# Patient Record
Sex: Female | Born: 1990 | Race: Black or African American | Hispanic: No | Marital: Single | Smoking: Never smoker
Health system: Southern US, Community
[De-identification: ages and names within clinical notes are randomized; demographics above are authoritative.]

## PROBLEM LIST (undated history)

## (undated) DIAGNOSIS — I509 Heart failure, unspecified: Secondary | ICD-10-CM

---

## 2015-08-25 ENCOUNTER — Ambulatory Visit
Admission: EM | Admit: 2015-08-25 | Discharge: 2015-08-25 | Disposition: A | Discharge status: Home / Self Care | Payer: Self-pay | Attending: Family Medicine | Admitting: Family Medicine

## 2015-08-25 ENCOUNTER — Encounter: Payer: Self-pay | Admitting: Emergency Medicine

## 2015-08-25 DIAGNOSIS — N390 Urinary tract infection, site not specified: Secondary | ICD-10-CM | POA: Insufficient documentation

## 2015-08-25 LAB — URINALYSIS COMPLETE WITH MICROSCOPIC (ARMC ONLY)
PH: 5 (ref 5.0–8.0)
Specific Gravity, Urine: 1.01 (ref 1.005–1.030)

## 2015-08-25 MED ORDER — PHENAZOPYRIDINE HCL 200 MG PO TABS: 200.0000 mg | ORAL_TABLET | Freq: Three times a day (TID) | ORAL | Status: DC | PRN

## 2015-08-25 MED ORDER — NITROFURANTOIN MONOHYD MACRO 100 MG PO CAPS: 100.0000 mg | ORAL_CAPSULE | Freq: Two times a day (BID) | ORAL | Status: DC

## 2015-08-25 MED ORDER — FLUCONAZOLE 150 MG PO TABS: 150.0000 mg | ORAL_TABLET | Freq: Once | ORAL | Status: DC

## 2015-08-25 NOTE — Discharge Instructions (Signed)
Urinary Tract Infection °A urinary tract infection (UTI) can occur any place along the urinary tract. The tract includes the kidneys, ureters, bladder, and urethra. A type of germ called bacteria often causes a UTI. UTIs are often helped with antibiotic medicine.  °HOME CARE  °· If given, take antibiotics as told by your doctor. Finish them even if you start to feel better. °· Drink enough fluids to keep your pee (urine) clear or pale yellow. °· Avoid tea, drinks with caffeine, and bubbly (carbonated) drinks. °· Pee often. Avoid holding your pee in for a long time. °· Pee before and after having sex (intercourse). °· Wipe from front to back after you poop (bowel movement) if you are a woman. Use each tissue only once. °GET HELP RIGHT AWAY IF:  °· You have back pain. °· You have lower belly (abdominal) pain. °· You have chills. °· You feel sick to your stomach (nauseous). °· You throw up (vomit). °· Your burning or discomfort with peeing does not go away. °· You have a fever. °· Your symptoms are not better in 3 days. °MAKE SURE YOU:  °· Understand these instructions. °· Will watch your condition. °· Will get help right away if you are not doing well or get worse. °  °This information is not intended to replace advice given to you by your health care provider. Make sure you discuss any questions you have with your health care provider. °  °Document Released: 08/03/2007 Document Revised: 03/07/2014 Document Reviewed: 09/15/2011 °Elsevier Interactive Patient Education ©2016 [employer] ° °Dysuria °Dysuria is pain or discomfort while urinating. The pain or discomfort may be felt in the tube that carries urine out of the bladder (urethra) or in the surrounding tissue of the genitals. The pain may also be felt in the groin area, lower abdomen, and lower back. You may have to urinate frequently or have the sudden feeling that you have to urinate (urgency). Dysuria can affect both men and women, but is more common in  women. °Dysuria can be caused by many different things, including: °· Urinary tract infection in women. °· Infection of the kidney or bladder. °· Kidney stones or bladder stones. °· Certain sexually transmitted infections (STIs), such as chlamydia. °· Dehydration. °· Inflammation of the vagina. °· Use of certain medicines. °· Use of certain soaps or scented products that cause irritation. °HOME CARE INSTRUCTIONS °Watch your dysuria for any changes. The following actions may help to reduce any discomfort you are feeling: °· Drink enough fluid to keep your urine clear or pale yellow. °· Empty your bladder often. Avoid holding urine for long periods of time. °· After a bowel movement or urination, women should cleanse from front to back, using each tissue only once. °· Empty your bladder after sexual intercourse. °· Take medicines only as directed by your health care provider. °· If you were prescribed an antibiotic medicine, finish it all even if you start to feel better. °· Avoid caffeine, tea, and alcohol. They can irritate the bladder and make dysuria worse. In men, alcohol may irritate the prostate. °· Keep all follow-up visits as directed by your health care provider. This is important. °· If you had any tests done to find the cause of dysuria, it is your responsibility to obtain your test results. Ask the lab or department performing the test when and how you will get your results. Talk with your health care provider if you have any questions about your results. °SEEK MEDICAL CARE   IF: °· You develop pain in your back or sides. °· You have a fever. °· You have nausea or vomiting. °· You have blood in your urine. °· You are not urinating as often as you usually do. °SEEK IMMEDIATE MEDICAL CARE IF: °· You pain is severe and not relieved with medicines. °· You are unable to hold down any fluids. °· You or someone else notices a change in your mental function. °· You have a rapid heartbeat at rest. °· You have  shaking or chills. °· You feel extremely weak. °  °This information is not intended to replace advice given to you by your health care provider. Make sure you discuss any questions you have with your health care provider. °  °Document Released: 11/13/2003 Document Revised: 03/07/2014 Document Reviewed: 10/10/2013 °Elsevier Interactive Patient Education ©2016 [employer]vier Inc. ° °

## 2015-08-25 NOTE — ED Provider Notes (Signed)
CSN: [id number]     Arrival date & time 08/25/15  1620 History   First MD Initiated Contact with Patient 08/25/15 1734    Nurses notes were reviewed. Chief Complaint  Patient presents with  . Urinary Frequency   Patient is here because of urinary tract symptoms. She reports frequency dysuria and urgency. She reports no vaginal discharge dyspareunia and she did was treated for bacterial vaginosis about 2-3 weeks ago. She never smoked and only surgeries C-section. No known drug allergies and no pertinent family medical history. She will be noted that states that she was having some burning that she did take some Azo which helped a little bit.    She never smoked. No known drug allergies and no pertinent or meaningful family history pertaining to today's visit    (Consider location/radiation/quality/duration/timing/severity/associated sxs/prior Treatment) Patient is a 25 y.o. female presenting with frequency. The history is provided by the patient. No language interpreter was used.  Urinary Frequency This is a new problem. The current episode started more than 2 days ago. The problem occurs constantly. The problem has been gradually worsening. Pertinent negatives include no chest pain, no abdominal pain, no headaches and no shortness of breath. Nothing aggravates the symptoms. Relieved by: azo. Treatments tried: Azo was used. The treatment provided no relief.    History reviewed. No pertinent past medical history. Past Surgical History  Procedure Laterality Date  . Cesarean section     No family history on file. Social History  Substance Use Topics  . Smoking status: Never Smoker   . Smokeless tobacco: Never Used  . Alcohol Use: Yes   OB History    No data available     Review of Systems  Respiratory: Negative for shortness of breath.   Cardiovascular: Negative for chest pain.  Gastrointestinal: Negative for abdominal pain.  Genitourinary: Positive for frequency.  Neurological:  Negative for headaches.  All other systems reviewed and are negative.   Allergies  Review of patient's allergies indicates no known allergies.  Home Medications   Prior to Admission medications   Medication Sig Start Date End Date Taking? Authorizing Provider  fluconazole (DIFLUCAN) 150 MG tablet Take 1 tablet (150 mg total) by mouth once. 08/25/15   Hassan RowanEugene Madicyn Mesina, MD  nitrofurantoin, macrocrystal-monohydrate, (MACROBID) 100 MG capsule Take 1 capsule (100 mg total) by mouth 2 (two) times daily. 08/25/15   Hassan RowanEugene Gurjot Brisco, MD  phenazopyridine (PYRIDIUM) 200 MG tablet Take 1 tablet (200 mg total) by mouth 3 (three) times daily as needed for pain. 08/25/15   Hassan RowanEugene Dariusz Brase, MD   Meds Ordered and Administered this Visit  Medications - No data to display  BP 114/83 mmHg  Pulse 80  Temp(Src) 97.7 F (36.5 C)  Resp 18  Ht 4\' 11"  (1.499 m)  Wt 130 lb (58.968 kg)  BMI 26.24 kg/m2  SpO2 100%  LMP 08/12/2015 No data found.   Physical Exam  Constitutional: She is oriented to person, place, and time. She appears well-developed and well-nourished.  HENT:  Head: Normocephalic and atraumatic.  Eyes: Conjunctivae are normal. Pupils are equal, round, and reactive to light.  Neck: Neck supple.  Abdominal: Soft. Bowel sounds are normal. There is no tenderness.  Musculoskeletal: Normal range of motion.  Neurological: She is alert and oriented to person, place, and time.  Skin: Skin is warm and dry.  Psychiatric: She has a normal mood and affect.  Vitals reviewed.   ED Course  Procedures (including critical care time)  Labs Review  Labs Reviewed  URINALYSIS COMPLETEWITH MICROSCOPIC ([facility] ONLY) - Abnormal; Notable for the following:    Color, Urine ORANGE (*)    APPearance CLOUDY (*)    Glucose, UA   (*)    Value: TEST NOT REPORTED DUE TO COLOR INTERFERENCE OF URINE PIGMENT   Bilirubin Urine   (*)    Value: TEST NOT REPORTED DUE TO COLOR INTERFERENCE OF URINE PIGMENT   Ketones, ur   (*)     Value: TEST NOT REPORTED DUE TO COLOR INTERFERENCE OF URINE PIGMENT   Hgb urine dipstick   (*)    Value: TEST NOT REPORTED DUE TO COLOR INTERFERENCE OF URINE PIGMENT   Protein, ur   (*)    Value: TEST NOT REPORTED DUE TO COLOR INTERFERENCE OF URINE PIGMENT   Nitrite   (*)    Value: TEST NOT REPORTED DUE TO COLOR INTERFERENCE OF URINE PIGMENT   Leukocytes, UA   (*)    Value: TEST NOT REPORTED DUE TO COLOR INTERFERENCE OF URINE PIGMENT   Bacteria, UA MANY (*)    Squamous Epithelial / LPF 6-30 (*)    All other components within normal limits  URINE CULTURE    Imaging Review No results found.   Visual Acuity Review  Right Eye Distance:   Left Eye Distance:   Bilateral Distance:    Right Eye Near:   Left Eye Near:    Bilateral Near:     Results for orders placed or performed during the hospital encounter of 08/25/15  Urinalysis complete, with microscopic  Result Value Ref Range   Color, Urine ORANGE (A) YELLOW   APPearance CLOUDY (A) CLEAR   Glucose, UA (A) NEGATIVE mg/dL    TEST NOT REPORTED DUE TO COLOR INTERFERENCE OF URINE PIGMENT   Bilirubin Urine (A) NEGATIVE    TEST NOT REPORTED DUE TO COLOR INTERFERENCE OF URINE PIGMENT   Ketones, ur (A) NEGATIVE mg/dL    TEST NOT REPORTED DUE TO COLOR INTERFERENCE OF URINE PIGMENT   Specific Gravity, Urine 1.010 1.005 - 1.030   Hgb urine dipstick (A) NEGATIVE    TEST NOT REPORTED DUE TO COLOR INTERFERENCE OF URINE PIGMENT   pH 5.0 5.0 - 8.0   Protein, ur (A) NEGATIVE mg/dL    TEST NOT REPORTED DUE TO COLOR INTERFERENCE OF URINE PIGMENT   Nitrite (A) NEGATIVE    TEST NOT REPORTED DUE TO COLOR INTERFERENCE OF URINE PIGMENT   Leukocytes, UA (A) NEGATIVE    TEST NOT REPORTED DUE TO COLOR INTERFERENCE OF URINE PIGMENT   RBC / HPF 0-5 0 - 5 RBC/hpf   WBC, UA TOO NUMEROUS TO COUNT 0 - 5 WBC/hpf   Bacteria, UA MANY (A) NONE SEEN   Squamous Epithelial / LPF 6-30 (A) NONE SEEN   WBC Clumps PRESENT      MDM   1. UTI (lower urinary  tract infection)    Unfortunately urine specimen was not good. Will will still treat with Macrobid since pH was only 5. Pyridium 200 mg 3 times a day for the next 5 days and Diflucan as needed for yeast infection since she was just on Flagyl or some other type anabiotic for bacterial vaginosis about 2-3 weeks ago. Follow-up with PCP in a week if needed.    Note: This dictation was prepared with Dragon dictation along with smaller phrase technology. Any transcriptional errors that result from this process are unintentional.  Hassan RowanEugene Kashus Karlen, MD 08/25/15 1820

## 2015-08-25 NOTE — ED Notes (Signed)
Frequency, pain when urinating, feels like bladder want empty for 2 days

## 2015-08-28 ENCOUNTER — Telehealth: Payer: Self-pay | Admitting: Emergency Medicine

## 2015-08-28 LAB — URINE CULTURE
Culture: >=100000 — AB
Special Requests: NORMAL

## 2015-08-28 NOTE — ED Notes (Signed)
Patient states that her urinary symptoms have completely resolved with taking the Macrobid.  Patient was informed of her urine culture result.  Patient was instructed to finish all of her antibiotic and if her symptoms worsen to follow-up here or with her PCP. Patient verbalized understanding.

## 2015-12-28 ENCOUNTER — Encounter: Payer: Self-pay | Admitting: Emergency Medicine

## 2015-12-28 ENCOUNTER — Ambulatory Visit
Admission: EM | Admit: 2015-12-28 | Discharge: 2015-12-28 | Disposition: A | Discharge status: Home / Self Care | Payer: Medicaid Other | Attending: Family Medicine | Admitting: Family Medicine

## 2015-12-28 DIAGNOSIS — N3090 Cystitis, unspecified without hematuria: Secondary | ICD-10-CM

## 2015-12-28 LAB — URINALYSIS COMPLETE WITH MICROSCOPIC (ARMC ONLY)
Bilirubin Urine: NEGATIVE
Glucose, UA: NEGATIVE mg/dL
KETONES UR: NEGATIVE mg/dL
Nitrite: NEGATIVE
PROTEIN: 30 mg/dL — AB
Specific Gravity, Urine: 1.025 (ref 1.005–1.030)
pH: 5.5 (ref 5.0–8.0)

## 2015-12-28 LAB — PREGNANCY, URINE: Preg Test, Ur: NEGATIVE

## 2015-12-28 MED ORDER — FLUCONAZOLE 150 MG PO TABS: 150.0000 mg | ORAL_TABLET | Freq: Every day | ORAL | 1 refills | Status: DC

## 2015-12-28 MED ORDER — CIPROFLOXACIN HCL 500 MG PO TABS: 500.0000 mg | ORAL_TABLET | Freq: Two times a day (BID) | ORAL | 0 refills | Status: DC

## 2015-12-28 NOTE — ED Triage Notes (Signed)
Patient c/o burning when urinating and increase in urinary frequency that started last night.   

## 2015-12-28 NOTE — ED Provider Notes (Signed)
[facility]    CSN: [id number] Arrival date & time: 12/28/15  1738     History   Chief Complaint Chief Complaint  Patient presents with  . Dysuria    HPI Allison Adams is a 25 y.o. female.   The history is provided by the patient.  Dysuria  Pain quality:  Burning Pain severity:  Mild Onset quality:  Sudden Duration:  1 day Timing:  Constant Progression:  Worsening Chronicity:  New Recent urinary tract infections: no   Relieved by:  None tried Urinary symptoms: frequent urination   Urinary symptoms: no discolored urine, no foul-smelling urine, no hematuria and no hesitancy   Associated symptoms: no abdominal pain, no fever, no flank pain, no genital lesions, no nausea, no vaginal discharge and no vomiting   Risk factors: sexually active   Risk factors: no hx of pyelonephritis, no hx of urolithiasis, no kidney transplant, not pregnant, no recurrent urinary tract infections, no renal cysts, no renal disease, no sexually transmitted infections, no single kidney and no urinary catheter     History reviewed. No pertinent past medical history.  There are no active problems to display for this patient.   Past Surgical History:  Procedure Laterality Date  . CESAREAN SECTION      OB History    Gravida Para Term Preterm AB Living   1             SAB TAB Ectopic Multiple Live Births                   Home Medications    Prior to Admission medications   Medication Sig Start Date End Date Taking? Authorizing Provider  ciprofloxacin (CIPRO) 500 MG tablet Take 1 tablet (500 mg total) by mouth every 12 (twelve) hours. 12/28/15   Payton Mccallumrlando Manuel Dall, MD  fluconazole (DIFLUCAN) 150 MG tablet Take 1 tablet (150 mg total) by mouth daily. 12/28/15   Payton Mccallumrlando Ermal Haberer, MD    Family History History reviewed. No pertinent family history.  Social History Social History  Substance Use Topics  . Smoking status: Never Smoker  . Smokeless tobacco: Never Used  . Alcohol  use Yes     Allergies   Review of patient's allergies indicates no known allergies.   Review of Systems Review of Systems  Constitutional: Negative for fever.  Gastrointestinal: Negative for abdominal pain, nausea and vomiting.  Genitourinary: Positive for dysuria. Negative for flank pain and vaginal discharge.     Physical Exam Triage Vital Signs ED Triage Vitals  Enc Vitals Group     BP 12/28/15 1805 122/85     Pulse Rate 12/28/15 1805 84     Resp 12/28/15 1805 16     Temp 12/28/15 1805 97 F (36.1 C)     Temp Source 12/28/15 1805 Tympanic     SpO2 12/28/15 1805 100 %     Weight 12/28/15 1805 130 lb (59 kg)     Height 12/28/15 1805 4\' 11"  (1.499 m)     Head Circumference --      Peak Flow --      Pain Score 12/28/15 1806 2     Pain Loc --      Pain Edu? --      Excl. in GC? --    No data found.   Updated Vital Signs BP 122/85 (BP Location: Right Arm)   Pulse 84   Temp 97 F (36.1 C) (Tympanic)   Resp 16   Ht 4'  11" (1.499 m)   Wt 130 lb (59 kg)   LMP 12/07/2015 (Approximate)   SpO2 100%   BMI 26.26 kg/m   Visual Acuity Right Eye Distance:   Left Eye Distance:   Bilateral Distance:    Right Eye Near:   Left Eye Near:    Bilateral Near:     Physical Exam  Constitutional: She appears well-developed and well-nourished. No distress.  Abdominal: Soft. She exhibits no distension and no mass. There is no tenderness. There is no rebound and no guarding.  Skin: She is not diaphoretic.  Nursing note and vitals reviewed.    UC Treatments / Results  Labs (all labs ordered are listed, but only abnormal results are displayed) Labs Reviewed  URINALYSIS COMPLETEWITH MICROSCOPIC ([facility] ONLY) - Abnormal; Notable for the following:       Result Value   APPearance CLOUDY (*)    Hgb urine dipstick LARGE (*)    Protein, ur 30 (*)    Leukocytes, UA MODERATE (*)    Bacteria, UA FEW (*)    Squamous Epithelial / LPF 0-5 (*)    All other components within normal  limits  URINE CULTURE  PREGNANCY, URINE    EKG  EKG Interpretation None       Radiology No results found.  Procedures Procedures (including critical care time)  Medications Ordered in UC Medications - No data to display   Initial Impression / Assessment and Plan / UC Course  I have reviewed the triage vital signs and the nursing notes.  Pertinent labs & imaging results that were available during my care of the patient were reviewed by me and considered in my medical decision making (see chart for details).  Clinical Course      Final Clinical Impressions(s) / UC Diagnoses   Final diagnoses:  Cystitis    New Prescriptions Discharge Medication List as of 12/28/2015  7:05 PM    START taking these medications   Details  ciprofloxacin (CIPRO) 500 MG tablet Take 1 tablet (500 mg total) by mouth every 12 (twelve) hours., Starting Mon 12/28/2015, Normal    fluconazole (DIFLUCAN) 150 MG tablet Take 1 tablet (150 mg total) by mouth daily., Starting Mon 12/28/2015, Normal       1. Lab results and diagnosis reviewed with patient 2. rx as per orders above; reviewed possible side effects, interactions, risks and benefits  3. Recommend supportive treatment with increased water intake, otc analgesics 4. Follow-up prn if symptoms worsen or don't improve   Payton Mccallumrlando Anuel Sitter, MD 12/28/15 1935

## 2015-12-31 LAB — URINE CULTURE: Culture: >=100000 — AB

## 2016-01-01 ENCOUNTER — Telehealth: Payer: Self-pay | Admitting: Emergency Medicine

## 2016-01-01 NOTE — Telephone Encounter (Signed)
Patient notified of her urine culture result.  Patient states that her symptoms have improved.  Patient instructed to finish her antibiotic and to follow-up here or with her PCP if her symptoms worsen.  Patient verbalized understanding.

## 2016-04-01 ENCOUNTER — Ambulatory Visit
Admission: EM | Admit: 2016-04-01 | Discharge: 2016-04-01 | Disposition: A | Discharge status: Home / Self Care | Payer: Medicaid Other | Attending: Family Medicine | Admitting: Family Medicine

## 2016-04-01 ENCOUNTER — Encounter: Payer: Self-pay | Admitting: Gynecology

## 2016-04-01 DIAGNOSIS — A64 Unspecified sexually transmitted disease: Secondary | ICD-10-CM | POA: Insufficient documentation

## 2016-04-01 DIAGNOSIS — N939 Abnormal uterine and vaginal bleeding, unspecified: Secondary | ICD-10-CM | POA: Diagnosis not present

## 2016-04-01 DIAGNOSIS — B3731 Acute candidiasis of vulva and vagina: Secondary | ICD-10-CM

## 2016-04-01 DIAGNOSIS — N760 Acute vaginitis: Secondary | ICD-10-CM

## 2016-04-01 DIAGNOSIS — B373 Candidiasis of vulva and vagina: Secondary | ICD-10-CM

## 2016-04-01 LAB — WET PREP, GENITAL
Sperm: NONE SEEN
Trich, Wet Prep: NONE SEEN

## 2016-04-01 LAB — CHLAMYDIA/NGC RT PCR (ARMC ONLY)
Chlamydia Tr: NOT DETECTED
N gonorrhoeae: NOT DETECTED

## 2016-04-01 MED ORDER — FLUCONAZOLE 150 MG PO TABS: ORAL_TABLET | ORAL | 0 refills | Status: DC

## 2016-04-01 MED ORDER — METRONIDAZOLE 500 MG PO TABS: 500.0000 mg | ORAL_TABLET | Freq: Two times a day (BID) | ORAL | 0 refills | Status: DC

## 2016-04-01 NOTE — ED Provider Notes (Signed)
CSN: [id number]     Arrival date & time 04/01/16  1619 History   First MD Initiated Contact with Patient 04/01/16 1742     Chief Complaint  Patient presents with  . SEXUALLY TRANSMITTED DISEASE  . Vaginitis   (Consider location/radiation/quality/duration/timing/severity/associated sxs/prior Treatment) HPI  Is a 26 year old female who presents with vaginal itching and discharge for 3-4 days. She states that she would like to be tested for STDs and HIV syphilis. States that she just started her period today. States that she has had multiple partners in the past but is currently not sexually active. He has just recently on amoxicillin for a abscess tooth is prone to yeast infections when she takes broad-spectrum antibiotics.      History reviewed. No pertinent past medical history. Past Surgical History:  Procedure Laterality Date  . CESAREAN SECTION     No family history on file. Social History  Substance Use Topics  . Smoking status: Never Smoker  . Smokeless tobacco: Never Used  . Alcohol use Yes   OB History    Gravida Para Term Preterm AB Living   1             SAB TAB Ectopic Multiple Live Births                 Review of Systems  Constitutional: Negative for activity change, appetite change, chills, fatigue and fever.  Genitourinary: Positive for vaginal bleeding and vaginal discharge. Negative for vaginal pain.  All other systems reviewed and are negative.   Allergies  Patient has no known allergies.  Home Medications   Prior to Admission medications   Medication Sig Start Date End Date Taking? Authorizing Provider  amoxicillin (AMOXIL) 500 MG tablet Take 500 mg by mouth 2 (two) times daily.   Yes Historical Provider, MD  fluconazole (DIFLUCAN) 150 MG tablet Take one tab for symptoms of yeast infection. Repeat x 1 in 72 hours. 04/01/16   Lutricia FeilWilliam P Roemer, PA-C  metroNIDAZOLE (FLAGYL) 500 MG tablet Take 1 tablet (500 mg total) by mouth 2 (two) times daily. 04/01/16    Lutricia FeilWilliam P Roemer, PA-C   Meds Ordered and Administered this Visit  Medications - No data to display  BP 120/76 (BP Location: Left Arm)   Pulse 80   Temp 98.5 F (36.9 C) (Oral)   Resp 16   Ht 4\' 11"  (1.499 m)   Wt 134 lb (60.8 kg)   LMP 04/09/2015   SpO2 100%   BMI 27.06 kg/m  No data found.   Physical Exam  Constitutional: She is oriented to person, place, and time. She appears well-developed and well-nourished. No distress.  HENT:  Head: Normocephalic and atraumatic.  Eyes: EOM are normal. Pupils are equal, round, and reactive to light. Right eye exhibits no discharge. Left eye exhibits no discharge.  Neck: Normal range of motion. Neck supple.  Genitourinary: Uterus normal. Vaginal discharge found.  Genitourinary Comments: Exam was performed with Durward Mallardamille, CMA as chaperone and assisted. Genitalia is normal. There is bright red blood introitus and bleeding covering her inner thighs and vulva. Speculum was then placed with the findings of more of blood. She did have some white creamy discharge adherent to the vaginal walls. Samples were obtained and sent to laboratory for analysis. Bimanuel was then performed after speculum removal. There is no tenderness to cervical motion no adnexal tenderness present.  Musculoskeletal: Normal range of motion.  Neurological: She is alert and oriented to person, place, and time.  Skin: Skin is warm and dry. She is not diaphoretic.  Psychiatric: She has a normal mood and affect. Her behavior is normal. Judgment and thought content normal.  Nursing note and vitals reviewed.   Urgent Care Course     Procedures (including critical care time)  Labs Review Labs Reviewed  WET PREP, GENITAL - Abnormal; Notable for the following:       Result Value   Yeast Wet Prep HPF POC PRESENT (*)    Clue Cells Wet Prep HPF POC PRESENT (*)    WBC, Wet Prep HPF POC FEW (*)    All other components within normal limits  CHLAMYDIA/NGC RT PCR ([facility]  ONLY)  HIV ANTIBODY (ROUTINE TESTING)  RPR    Imaging Review No results found.   Visual Acuity Review  Right Eye Distance:   Left Eye Distance:   Bilateral Distance:    Right Eye Near:   Left Eye Near:    Bilateral Near:         MDM   1. Vaginitis and vulvovaginitis   2. Candida vaginitis    New Prescriptions   FLUCONAZOLE (DIFLUCAN) 150 MG TABLET    Take one tab for symptoms of yeast infection. Repeat x 1 in 72 hours.   METRONIDAZOLE (FLAGYL) 500 MG TABLET    Take 1 tablet (500 mg total) by mouth 2 (two) times daily.  Plan: 1. Test/x-ray results and diagnosis reviewed with patient 2. rx as per orders; risks, benefits, potential side effects reviewed with patient 3. Recommend supportive treatment with Use of medicines as prescribed. The condom during sex while on the medications. Avoid alcohol use while on Flagyl. Patient will be notified of results of the STD workup. 4. F/u prn if symptoms worsen or don't improve     Lutricia Feil, PA-C 04/01/16 1830

## 2016-04-01 NOTE — ED Triage Notes (Signed)
Per patient c/o vaginal itching / discharge x 3-4 days ago. Per patient would like to be tested for Yeast infection, STDs and HIV.

## 2016-04-02 LAB — RPR: RPR Ser Ql: NONREACTIVE

## 2016-04-02 LAB — HIV ANTIBODY (ROUTINE TESTING W REFLEX): HIV Screen 4th Generation wRfx: NONREACTIVE

## 2016-06-06 ENCOUNTER — Encounter: Payer: Self-pay | Admitting: *Deleted

## 2016-06-06 ENCOUNTER — Ambulatory Visit
Admission: EM | Admit: 2016-06-06 | Discharge: 2016-06-06 | Disposition: A | Discharge status: Home / Self Care | Payer: Medicaid Other | Attending: Family Medicine | Admitting: Family Medicine

## 2016-06-06 DIAGNOSIS — Z3201 Encounter for pregnancy test, result positive: Secondary | ICD-10-CM | POA: Insufficient documentation

## 2016-06-06 DIAGNOSIS — N926 Irregular menstruation, unspecified: Secondary | ICD-10-CM

## 2016-06-06 LAB — PREGNANCY, URINE: PREG TEST UR: POSITIVE — AB

## 2016-06-06 NOTE — ED Provider Notes (Signed)
[facility] ____________________________________________  Time seen: Approximately 1:56 PM  I have reviewed the triage vital signs and the nursing notes.   HISTORY  Chief Complaint Possible Pregnancy   HPI Allison Adams is a 26 y.o. female presents today for request a pregnancy test. Patient reports last menstrual. First day was 05/02/2016. Patient reports she did have unprotected sexual intercourse with her normal partner shortly after her menstrual, and an reports her menstrual has not yet started. Patient states that she did take a home pregnancy test yesterday which was positive. Patient states that she is here for recheck of pregnancy test.  Patient states that she feels well. Does report some breast tenderness. Denies pain. Denies fevers, dysuria, abdominal pain, vaginal discharge, vaginal complaints,vaginal bleeding, vaginal discharge, chest pain, shortness of breath or rash. Reports continues to eat and drink well. Denies concerns of STDs. Reports gravida 1, para 1. Patient reports that she did develop preeclampsia at the end of her pregnancy and her child was delivered by C-section. Denies post pregnancy, complications. Reports her previous OB/GYN was in [place]. Patient states that she's does not drink or smoke.  Patient's last menstrual period was 05/02/2016 (exact date).  Reports gravida 1, para 1.  History reviewed. No pertinent past medical history.  There are no active problems to display for this patient.   Past Surgical History:  Procedure Laterality Date  . CESAREAN SECTION       No current facility-administered medications for this encounter.   Current Outpatient Prescriptions:  .  amoxicillin (AMOXIL) 500 MG tablet, Take 500 mg by mouth 2 (two) times daily., Disp: , Rfl:  .  fluconazole (DIFLUCAN) 150 MG tablet, Take one tab for symptoms of yeast infection. Repeat x 1 in 72 hours., Disp: 2 tablet, Rfl: 0 .  metroNIDAZOLE (FLAGYL) 500 MG tablet,  Take 1 tablet (500 mg total) by mouth 2 (two) times daily., Disp: 14 tablet, Rfl: 0  Allergies Patient has no known allergies.  History reviewed. No pertinent family history.  Social History Social History  Substance Use Topics  . Smoking status: Never Smoker  . Smokeless tobacco: Never Used  . Alcohol use Yes    Review of Systems Constitutional: No fever/chills. Cardiovascular: Denies chest pain. Respiratory: Denies shortness of breath. Gastrointestinal: No abdominal pain.  Genitourinary: Negative for dysuria. Musculoskeletal: Negative for back pain. Skin: Negative for rash.   ____________________________________________   PHYSICAL EXAM:  VITAL SIGNS: ED Triage Vitals  Enc Vitals Group     BP 06/06/16 1106 111/75     Pulse Rate 06/06/16 1106 84     Resp 06/06/16 1106 16     Temp 06/06/16 1106 99 F (37.2 C)     Temp Source 06/06/16 1106 Oral     SpO2 06/06/16 1106 100 %     Weight 06/06/16 1108 135 lb (61.2 kg)     Height 06/06/16 1108  (1.499 m)     Head Circumference --      Peak Flow --      Pain Score --      Pain Loc --      Pain Edu? --      Excl. in GC? --     Constitutional: Alert and oriented. Well appearing and in no acute distress. Eyes: Conjunctivae are normal. ENT      Head: Normocephalic and atraumatic. Hematological/Lymphatic/Immunilogical: No cervical lymphadenopathy. Cardiovascular: Normal rate, regular rhythm. Grossly normal heart sounds.  Good peripheral circulation. Respiratory: Normal respiratory effort without tachypnea  nor retractions. Breath sounds are clear and equal bilaterally. No wheezes, rales, rhonchi. Gastrointestinal: Soft and nontender. No distention. No CVA tenderness. Musculoskeletal:  Steady gait. No midline cervical, thoracic or lumbar tenderness to palpation. Neurologic:  Normal speech and language. Speech is normal. No gait instability.  Skin:  Skin is warm, dry. Psychiatric: Mood and affect are normal. Speech  and behavior are normal. Patient exhibits appropriate insight and judgment   ___________________________________________   LABS (all labs ordered are listed, but only abnormal results are displayed)  Labs Reviewed  PREGNANCY, URINE - Abnormal; Notable for the following:       Result Value   Preg Test, Ur POSITIVE (*)    All other components within normal limits     PROCEDURES Procedures   INITIAL IMPRESSION / ASSESSMENT AND PLAN / ED COURSE  Pertinent labs & imaging results that were available during my care of the patient were reviewed by me and considered in my medical decision making (see chart for details).  Well-appearing patient. No acute distress. Presenting for evaluation of pregnancy. Urine pregnancy positive. Patient denies other complaints. Encouraged patient to start otc prenatal vitamins and call to schedule follow-up with OB/GYN. Discussed strict follow-up and return parameters.  Discussed follow up and return parameters including no resolution or any worsening concerns. Patient verbalized understanding and agreed to plan.   ____________________________________________   FINAL CLINICAL IMPRESSION(S) / ED DIAGNOSES  Final diagnoses:  Positive urine pregnancy test     Discharge Medication List as of 06/06/2016 11:43 AM      Note: This dictation was prepared with Dragon dictation along with smaller phrase technology. Any transcriptional errors that result from this process are unintentional.         Renford Dills, NP 06/06/16 1401

## 2016-06-06 NOTE — Discharge Instructions (Signed)
Take prenatal vitamins. Drink plenty of fluids.   Call to schedule OBGYN visit.  Follow up with your primary care physician this week as needed. Return to Urgent care for new or worsening concerns.

## 2016-06-06 NOTE — ED Triage Notes (Signed)
Pt used pregnancy test at home, here to confirm.

## 2016-06-16 ENCOUNTER — Ambulatory Visit
Admission: EM | Admit: 2016-06-16 | Discharge: 2016-06-16 | Discharge status: Absent without leave | Payer: Medicaid Other

## 2016-11-07 ENCOUNTER — Ambulatory Visit
Admission: EM | Admit: 2016-11-07 | Discharge: 2016-11-07 | Disposition: A | Discharge status: Home / Self Care | Payer: Medicaid Other | Attending: Family Medicine | Admitting: Family Medicine

## 2016-11-07 DIAGNOSIS — N760 Acute vaginitis: Secondary | ICD-10-CM

## 2016-11-07 DIAGNOSIS — O26892 Other specified pregnancy related conditions, second trimester: Secondary | ICD-10-CM | POA: Insufficient documentation

## 2016-11-07 DIAGNOSIS — Z7982 Long term (current) use of aspirin: Secondary | ICD-10-CM | POA: Insufficient documentation

## 2016-11-07 DIAGNOSIS — Z79899 Other long term (current) drug therapy: Secondary | ICD-10-CM | POA: Diagnosis not present

## 2016-11-07 DIAGNOSIS — Z3A27 27 weeks gestation of pregnancy: Secondary | ICD-10-CM | POA: Diagnosis not present

## 2016-11-07 LAB — URINALYSIS, COMPLETE (UACMP) WITH MICROSCOPIC
Bacteria, UA: NONE SEEN
Bilirubin Urine: NEGATIVE
GLUCOSE, UA: NEGATIVE mg/dL
KETONES UR: NEGATIVE mg/dL
LEUKOCYTES UA: NEGATIVE
Nitrite: NEGATIVE
PROTEIN: NEGATIVE mg/dL
Specific Gravity, Urine: 1.015 (ref 1.005–1.030)
WBC, UA: NONE SEEN WBC/hpf (ref 0–5)
pH: 7 (ref 5.0–8.0)

## 2016-11-07 LAB — WET PREP, GENITAL
Clue Cells Wet Prep HPF POC: NONE SEEN
Sperm: NONE SEEN
Trich, Wet Prep: NONE SEEN
Yeast Wet Prep HPF POC: NONE SEEN

## 2016-11-07 NOTE — ED Triage Notes (Signed)
Patient complains of possible yeast infection. Patient states that she took Monistat 3-4 days ago. Patient states that she only took one day of it because it burned very badly. Patient states that she is currently [redacted] weeks pregnant. Patient states that she did not call her OB doctor.

## 2016-11-07 NOTE — ED Notes (Signed)
Fetal heart tones obtained 154.

## 2016-11-07 NOTE — Discharge Instructions (Signed)
Rest. Drink plenty of fluids. Continue to monitor closely as discussed. Follow up with your OBGYN.  Follow up with your primary care physician this week as needed. Return to Urgent care for new or worsening concerns.

## 2016-11-07 NOTE — ED Provider Notes (Addendum)
[facility] ____________________________________________  Time seen: Approximately 12:06 PM  I have reviewed the triage vital signs and the nursing notes.   HISTORY  Chief Complaint Vaginitis   HPI Allison Adams is a 26 y.o. female  presenting for evaluation of vaginal irritation and vaginal burning. Patient states she is [redacted] weeks pregnant and [facility]. Patient reports the last few days she has had vaginal irritation and some burning to the vaginal area, which she states that over-the-counter Monistat cream did help. States currently no burning or vaginal irritation, and states that she would make sure she did not have a bacterial or release infection. Denies urinary urgency, frequency or burning with urination. States that she does have some whitish vaginal discharge, but states that is her normal discharge. Denies any odor to discharge. Denies any concerns of STDs. Denies bowel changes. Denies fevers, vomiting or abdominal pain. states no vaginal bleeding or abdominal pain. States pregnancy has been going well without complications. Reports gravida 2 para 1 areas reports preeclampsia with first pregnancy.Denies recent sickness. Denies recent antibiotic use. Reports baby continues to move well and remain active.    History reviewed. No pertinent past medical history. As above.  There are no active problems to display for this patient.   Past Surgical History:  Procedure Laterality Date  . CESAREAN SECTION       No current facility-administered medications for this encounter.   Current Outpatient Prescriptions:  .  aspirin 81 MG chewable tablet, Chew by mouth daily., Disp: , Rfl:  .  prenatal vitamin w/FE, FA (PRENATAL 1 + 1) 27-1 MG TABS tablet, Take 1 tablet by mouth daily at 12 noon., Disp: , Rfl:  .  amoxicillin (AMOXIL) 500 MG tablet, Take 500 mg by mouth 2 (two) times daily., Disp: , Rfl:  .  fluconazole (DIFLUCAN) 150 MG tablet, Take one tab for  symptoms of yeast infection. Repeat x 1 in 72 hours., Disp: 2 tablet, Rfl: 0 .  metroNIDAZOLE (FLAGYL) 500 MG tablet, Take 1 tablet (500 mg total) by mouth 2 (two) times daily., Disp: 14 tablet, Rfl: 0  Allergies Patient has no known allergies.  History reviewed. No pertinent family history.  Social History Social History  Substance Use Topics  . Smoking status: Never Smoker  . Smokeless tobacco: Never Used  . Alcohol use No    Review of Systems Constitutional: No fever/chills Cardiovascular: Denies chest pain. Respiratory: Denies shortness of breath. Gastrointestinal: No abdominal pain.  No nausea, no vomiting.  No diarrhea.  No constipation. Genitourinary: AS above/  Musculoskeletal: Negative for back pain. Skin: Negative for rash.   ____________________________________________   PHYSICAL EXAM:  VITAL SIGNS: ED Triage Vitals [11/07/16 1057]  Enc Vitals Group     BP 105/71     Pulse Rate 97     Resp 18     Temp 98.6 F (37 C)     Temp Source Oral     SpO2 100 %     Weight      Height      Head Circumference      Peak Flow      Pain Score 0     Pain Loc      Pain Edu?      Excl. in GC?    Fetal heart tones 156 per [facility].  Constitutional: Alert and oriented. Well appearing and in no acute distress. Cardiovascular: Normal rate, regular rhythm. Grossly normal heart sounds.  Good peripheral circulation. Respiratory:  Normal respiratory effort without tachypnea nor retractions. Breath sounds are clear and equal bilaterally. No wheezes, rales, rhonchi. Gastrointestinal: Soft and nontender. Gravid abdomen. No CVA tenderness. Pelvic: completed with Melissa CMA at bedside as chaperone.  External: normal appearance, no rash or lesions. Speculum: mild whitish vaginal discharge, no bleeding, cervical os closed. Bimanual: Nontender, no cervical or adnexal tenderness. Musculoskeletal:  No midline cervical, thoracic or lumbar tenderness to palpation. Steady gait.    Neurologic:  Normal speech and language.Speech is normal. No gait instability.  Skin:  Skin is warm, dry. Psychiatric: Mood and affect are normal. Speech and behavior are normal. Patient exhibits appropriate insight and judgment   ___________________________________________   LABS (all labs ordered are listed, but only abnormal results are displayed)  Labs Reviewed  WET PREP, GENITAL - Abnormal; Notable for the following:       Result Value   WBC, Wet Prep HPF POC MANY (*)    All other components within normal limits  URINALYSIS, COMPLETE (UACMP) WITH MICROSCOPIC - Abnormal; Notable for the following:    Hgb urine dipstick TRACE (*)    Squamous Epithelial / LPF 0-5 (*)    All other components within normal limits   ____________________________________________   PROCEDURES Procedures    INITIAL IMPRESSION / ASSESSMENT AND PLAN / ED COURSE  Pertinent labs & imaging results that were available during my care of the patient were reviewed by me and considered in my medical decision making (see chart for details).  Well-appearing patient. No acute distress. Vaginal burning and discomfort with improvement after topical Monistat. Expressed similar symptoms with past yeast infection. Urine unremarkable. Wet prep evaluated, no clue cells or yeast noted. Discussed supportive care and close monitoring. Encouraged follow-up with OB/GYN. Discussed strict follow-up and return parameters.  Discussed follow up with Primary care physician this week. Discussed follow up and return parameters including no resolution or any worsening concerns. Patient verbalized understanding and agreed to plan.   ____________________________________________   FINAL CLINICAL IMPRESSION(S) / ED DIAGNOSES  Final diagnoses:  Acute vaginitis     Discharge Medication List as of 11/07/2016  1:17 PM      Note: This dictation was prepared with Dragon dictation along with smaller phrase technology. Any  transcriptional errors that result from this process are unintentional.         Renford Dills, NP 11/07/16 1407    Renford Dills, NP 11/07/16 1408

## 2017-03-13 ENCOUNTER — Other Ambulatory Visit: Payer: Self-pay

## 2017-03-13 ENCOUNTER — Ambulatory Visit
Admission: EM | Admit: 2017-03-13 | Discharge: 2017-03-13 | Disposition: A | Discharge status: Home / Self Care | Payer: Medicaid Other | Attending: Family Medicine | Admitting: Family Medicine

## 2017-03-13 DIAGNOSIS — Z79899 Other long term (current) drug therapy: Secondary | ICD-10-CM | POA: Insufficient documentation

## 2017-03-13 DIAGNOSIS — B349 Viral infection, unspecified: Secondary | ICD-10-CM

## 2017-03-13 DIAGNOSIS — Z7982 Long term (current) use of aspirin: Secondary | ICD-10-CM | POA: Diagnosis not present

## 2017-03-13 DIAGNOSIS — J029 Acute pharyngitis, unspecified: Secondary | ICD-10-CM

## 2017-03-13 LAB — RAPID STREP SCREEN (MED CTR MEBANE ONLY): STREPTOCOCCUS, GROUP A SCREEN (DIRECT): NEGATIVE

## 2017-03-13 NOTE — ED Provider Notes (Signed)
[facility]    CSN: [id number] Arrival date & time: 03/13/17  1208     History   Chief Complaint Chief Complaint  Patient presents with  . Sore Throat    HPI Allison Adams is a 27 y.o. female.    URI  Presenting symptoms: congestion, rhinorrhea and sore throat   Severity:  Moderate Onset quality:  Sudden Duration:  5 days Timing:  Constant Progression:  Unchanged Chronicity:  New Relieved by:  OTC medications Associated symptoms: no headaches, no sinus pain and no wheezing   Risk factors: sick contacts   Risk factors: not elderly, no chronic cardiac disease, no chronic kidney disease, no chronic respiratory disease, no diabetes mellitus, no immunosuppression, no recent illness and no recent travel     History reviewed. No pertinent past medical history.  There are no active problems to display for this patient.   Past Surgical History:  Procedure Laterality Date  . CESAREAN SECTION      OB History    Gravida Para Term Preterm AB Living   1             SAB TAB Ectopic Multiple Live Births                   Home Medications    Prior to Admission medications   Medication Sig Start Date End Date Taking? Authorizing Provider  prenatal vitamin w/FE, FA (PRENATAL 1 + 1) 27-1 MG TABS tablet Take 1 tablet by mouth daily at 12 noon.   Yes [provider]  amoxicillin (AMOXIL) 500 MG tablet Take 500 mg by mouth 2 (two) times daily.    [provider]  aspirin 81 MG chewable tablet Chew by mouth daily.    [provider]  fluconazole (DIFLUCAN) 150 MG tablet Take one tab for symptoms of yeast infection. Repeat x 1 in 72 hours. 04/01/16   Lutricia Feiloemer, William P, PA-C  metroNIDAZOLE (FLAGYL) 500 MG tablet Take 1 tablet (500 mg total) by mouth 2 (two) times daily. 04/01/16   Lutricia Feiloemer, William P, PA-C    Family History Family History  Problem Relation Age of Onset  . Diabetes Mother   . Lupus Mother     Social History Social History    Tobacco Use  . Smoking status: Never Smoker  . Smokeless tobacco: Never Used  Substance Use Topics  . Alcohol use: No  . Drug use: No     Allergies   Patient has no known allergies.   Review of Systems Review of Systems  HENT: Positive for congestion, rhinorrhea and sore throat. Negative for sinus pain.   Respiratory: Negative for wheezing.   Neurological: Negative for headaches.     Physical Exam Triage Vital Signs ED Triage Vitals  Enc Vitals Group     BP 03/13/17 1226 107/79     Pulse Rate 03/13/17 1226 100     Resp 03/13/17 1226 18     Temp 03/13/17 1226 98.3 F (36.8 C)     Temp Source 03/13/17 1226 Oral     SpO2 03/13/17 1226 100 %     Weight 03/13/17 1224 130 lb (59 kg)     Height 03/13/17 1224 4\' 11"  (1.499 m)     Head Circumference --      Peak Flow --      Pain Score 03/13/17 1224 2     Pain Loc --      Pain Edu? --  Excl. in GC? --    No data found.  Updated Vital Signs BP 107/79 (BP Location: Left Arm)   Pulse 100   Temp 98.3 F (36.8 C) (Oral)   Resp 18   Ht 4\' 11"  (1.499 m)   Wt 130 lb (59 kg)   LMP 05/02/2016 (Exact Date)   SpO2 100%   Breastfeeding? Yes   BMI 26.26 kg/m   Visual Acuity Right Eye Distance:   Left Eye Distance:   Bilateral Distance:    Right Eye Near:   Left Eye Near:    Bilateral Near:     Physical Exam  Constitutional: She appears well-developed and well-nourished. No distress.  HENT:  Head: Normocephalic and atraumatic.  Right Ear: Tympanic membrane, external ear and ear canal normal.  Left Ear: Tympanic membrane, external ear and ear canal normal.  Nose: Mucosal edema and rhinorrhea present. No nose lacerations, sinus tenderness, nasal deformity, septal deviation or nasal septal hematoma. No epistaxis.  No foreign bodies. Right sinus exhibits no maxillary sinus tenderness and no frontal sinus tenderness. Left sinus exhibits no maxillary sinus tenderness and no frontal sinus tenderness.  Mouth/Throat:  Uvula is midline, oropharynx is clear and moist and mucous membranes are normal. No oropharyngeal exudate.  Eyes: Conjunctivae and EOM are normal. Pupils are equal, round, and reactive to light. Right eye exhibits no discharge. Left eye exhibits no discharge. No scleral icterus.  Neck: Normal range of motion. Neck supple. No thyromegaly present.  Cardiovascular: Normal rate, regular rhythm and normal heart sounds.  Pulmonary/Chest: Effort normal and breath sounds normal. No stridor. No respiratory distress. She has no wheezes. She has no rales.  Lymphadenopathy:    She has no cervical adenopathy.  Skin: She is not diaphoretic.  Nursing note and vitals reviewed.    UC Treatments / Results  Labs (all labs ordered are listed, but only abnormal results are displayed) Labs Reviewed  RAPID STREP SCREEN (NOT AT [facility])  CULTURE, GROUP A STREP [facility])    EKG  EKG Interpretation None       Radiology No results found.  Procedures Procedures (including critical care time)  Medications Ordered in UC Medications - No data to display   Initial Impression / Assessment and Plan / UC Course  I have reviewed the triage vital signs and the nursing notes.  Pertinent labs & imaging results that were available during my care of the patient were reviewed by me and considered in my medical decision making (see chart for details).       Final Clinical Impressions(s) / UC Diagnoses   Final diagnoses:  Viral pharyngitis    ED Discharge Orders    None     1. Lab results and diagnosis reviewed with patient 2. Recommend supportive treatment with increased fluids, otc analgesics  3. Follow-up prn if symptoms worsen or don't improve   Controlled Substance Prescriptions  Controlled Substance Registry consulted? Not Applicable   Payton Mccallum, MD 03/13/17 [phone]

## 2017-03-13 NOTE — ED Triage Notes (Signed)
Patient complains of sore throat that started 1 week ago. Patient reports that multiple of her children have been coughing and have been sick.

## 2017-03-16 LAB — CULTURE, GROUP A STREP (THRC)

## 2017-07-06 ENCOUNTER — Encounter: Payer: Self-pay | Admitting: Emergency Medicine

## 2017-07-06 ENCOUNTER — Emergency Department: Payer: Medicaid Other

## 2017-07-06 ENCOUNTER — Other Ambulatory Visit: Payer: Self-pay

## 2017-07-06 ENCOUNTER — Inpatient Hospital Stay
Admission: EM | Admit: 2017-07-06 | Discharge: 2017-07-08 | DRG: 314 | Disposition: A | Discharge status: Other Acute Inpatient Hospital | Payer: Medicaid Other | Attending: Internal Medicine | Admitting: Internal Medicine

## 2017-07-06 DIAGNOSIS — I429 Cardiomyopathy, unspecified: Secondary | ICD-10-CM

## 2017-07-06 DIAGNOSIS — N179 Acute kidney failure, unspecified: Secondary | ICD-10-CM | POA: Diagnosis present

## 2017-07-06 DIAGNOSIS — J810 Acute pulmonary edema: Secondary | ICD-10-CM | POA: Diagnosis present

## 2017-07-06 DIAGNOSIS — I340 Nonrheumatic mitral (valve) insufficiency: Secondary | ICD-10-CM | POA: Diagnosis present

## 2017-07-06 DIAGNOSIS — I248 Other forms of acute ischemic heart disease: Secondary | ICD-10-CM | POA: Diagnosis present

## 2017-07-06 DIAGNOSIS — O903 Peripartum cardiomyopathy: Secondary | ICD-10-CM

## 2017-07-06 LAB — BASIC METABOLIC PANEL
Anion gap: 8 (ref 5–15)
BUN: 29 mg/dL — ABNORMAL HIGH (ref 6–20)
CO2: 22 mmol/L (ref 22–32)
CREATININE: 1.27 mg/dL — AB (ref 0.44–1.00)
Calcium: 8.3 mg/dL — ABNORMAL LOW (ref 8.9–10.3)
Chloride: 107 mmol/L (ref 101–111)
GFR calc Af Amer: >60 mL/min (ref >60)
GFR calc non Af Amer: 57 mL/min — ABNORMAL LOW (ref >60)
Glucose, Bld: 96 mg/dL (ref 65–99)
POTASSIUM: 3.8 mmol/L (ref 3.5–5.1)
SODIUM: 137 mmol/L (ref 135–145)

## 2017-07-06 LAB — TROPONIN I
TROPONIN I: 0.04 ng/mL — AB (ref <0.03)
Troponin I: 0.05 ng/mL (ref <0.03)

## 2017-07-06 LAB — CBC
HCT: 37.6 % (ref 35.0–47.0)
HEMOGLOBIN: 12.7 g/dL (ref 12.0–16.0)
MCH: 29.6 pg (ref 26.0–34.0)
MCHC: 33.7 g/dL (ref 32.0–36.0)
MCV: 87.8 fL (ref 80.0–100.0)
PLATELETS: 320 10*3/uL (ref 150–440)
RBC: 4.28 MIL/uL (ref 3.80–5.20)
RDW: 15.5 % — ABNORMAL HIGH (ref 11.5–14.5)
WBC: 7.1 10*3/uL (ref 3.6–11.0)

## 2017-07-06 LAB — URINE DRUG SCREEN, QUALITATIVE (ARMC ONLY)
AMPHETAMINES, UR SCREEN: NOT DETECTED
Barbiturates, Ur Screen: NOT DETECTED
Benzodiazepine, Ur Scrn: NOT DETECTED
COCAINE METABOLITE, UR ~~LOC~~: NOT DETECTED
Cannabinoid 50 Ng, Ur ~~LOC~~: NOT DETECTED
MDMA (Ecstasy)Ur Screen: NOT DETECTED
METHADONE SCREEN, URINE: NOT DETECTED
OPIATE, UR SCREEN: NOT DETECTED
Phencyclidine (PCP) Ur S: NOT DETECTED
Tricyclic, Ur Screen: NOT DETECTED

## 2017-07-06 LAB — BRAIN NATRIURETIC PEPTIDE: B Natriuretic Peptide: 684 pg/mL — ABNORMAL HIGH (ref 0.0–100.0)

## 2017-07-06 LAB — POCT PREGNANCY, URINE: Preg Test, Ur: NEGATIVE

## 2017-07-06 LAB — TSH: TSH: 3.691 u[IU]/mL (ref 0.350–4.500)

## 2017-07-06 MED ORDER — FUROSEMIDE 10 MG/ML IJ SOLN
20.0000 mg | Freq: Once | INTRAMUSCULAR | Status: AC
  Administered 2017-07-06: 20 mg via INTRAVENOUS
  Filled 2017-07-06: qty 4

## 2017-07-06 MED ORDER — IOPAMIDOL (ISOVUE-370) INJECTION 76%
75.0000 mL | Freq: Once | INTRAVENOUS | Status: AC | PRN
  Administered 2017-07-06: 75 mL via INTRAVENOUS

## 2017-07-06 MED ORDER — SODIUM CHLORIDE 0.9 % IV BOLUS
1000.0000 mL | Freq: Once | INTRAVENOUS | Status: AC
  Administered 2017-07-06: 1000 mL via INTRAVENOUS

## 2017-07-06 NOTE — ED Provider Notes (Signed)
[facility] Emergency Department Provider Note  ____________________________________________  Time seen: Approximately 2:42 PM  I have reviewed the triage vital signs and the nursing notes.   HISTORY  Chief Complaint Palpitations   HPI Allison Adams is a 27 y.o. female no significant past medical history who presents for evaluation of palpitations and shortness of breath.  Patient reports 1 week of palpitations which she describes as feeling her heart pounding in her chest.  Those symptoms are constant and have been getting progressively worse.  She was referred to a cardiologist by her primary care doctor and has an appointment tomorrow.  Today she started having chest pain and the shortness of breath became worse which prompted her visit to the emergency room she describes the pain as a tightness located mostly in the left side of her chest, nonpleuritic, constant and moderate in intensity.  She is also feeling short of breath which is worse with exertion.  She denies weight gain or leg swelling.  She denies personal or family history of blood clots.  She is status post spontaneous vaginal delivery back in December 2019 at [facility].  She is currently breast-feeding.  She does not take any birth control at this time.  She is not a smoker.  She denies personal history of heart problems however has a strong family history of ischemic heart disease.  Patient had preeclampsia with her first pregnancy but no complications with her most recent pregnancy.  PMH Pre-eclampsia   Past Surgical History:  Procedure Laterality Date  . CESAREAN SECTION      Prior to Admission medications   Medication Sig Start Date End Date Taking? Authorizing Provider  prenatal vitamin w/FE, FA (PRENATAL 1 + 1) 27-1 MG TABS tablet Take 1 tablet by mouth daily at 12 noon.   Yes [provider]    Allergies Patient has no known allergies.  Family History  Problem Relation Age of  Onset  . Diabetes Mother   . Lupus Mother     Social History Social History   Tobacco Use  . Smoking status: Never Smoker  . Smokeless tobacco: Never Used  Substance Use Topics  . Alcohol use: No  . Drug use: No    Review of Systems  Constitutional: Negative for fever. Eyes: Negative for visual changes. ENT: Negative for sore throat. Neck: No neck pain  Cardiovascular: + chest pain. + palpitations Respiratory: + shortness of breath. Gastrointestinal: Negative for abdominal pain, vomiting or diarrhea. Genitourinary: Negative for dysuria. Musculoskeletal: Negative for back pain. Skin: Negative for rash. Neurological: Negative for headaches, weakness or numbness. Psych: No SI or HI  ____________________________________________   PHYSICAL EXAM:  VITAL SIGNS: ED Triage Vitals [07/06/17 1041]  Enc Vitals Group     BP 126/86     Pulse Rate (!) 118     Resp 14     Temp 98.4 F (36.9 C)     Temp Source Oral     SpO2 99 %     Weight 135 lb (61.2 kg)     Height  (1.499 m)     Head Circumference      Peak Flow      Pain Score 5     Pain Loc      Pain Edu?      Excl. in GC?     Constitutional: Alert and oriented. Well appearing and in no apparent distress. HEENT:      Head: Normocephalic and atraumatic.  Eyes: Conjunctivae are normal. Sclera is non-icteric.       Mouth/Throat: Mucous membranes are moist.       Neck: Supple with no signs of meningismus. Cardiovascular: Tachycardic with regular. No murmurs, gallops, or rubs. 2+ symmetrical distal pulses are present in all extremities. No JVD. Respiratory: Normal respiratory effort. Lungs are clear to auscultation bilaterally. No wheezes, crackles, or rhonchi.  Gastrointestinal: Soft, non tender, and non distended with positive bowel sounds. No rebound or guarding. Genitourinary: No CVA tenderness. Musculoskeletal: Nontender with normal range of motion in all extremities. No edema, cyanosis, or erythema  of extremities. Neurologic: Normal speech and language. Face is symmetric. Moving all extremities. No gross focal neurologic deficits are appreciated. Skin: Skin is warm, dry and intact. No rash noted. Psychiatric: Mood and affect are normal. Speech and behavior are normal.  ____________________________________________   LABS (all labs ordered are listed, but only abnormal results are displayed)  Labs Reviewed  BASIC METABOLIC PANEL - Abnormal; Notable for the following components:      Result Value   BUN 29 (*)    Creatinine, Ser 1.27 (*)    Calcium 8.3 (*)    GFR calc non Af Amer 57 (*)    All other components within normal limits  CBC - Abnormal; Notable for the following components:   RDW 15.5 (*)    All other components within normal limits  TROPONIN I - Abnormal; Notable for the following components:   Troponin I 0.05 (*)    All other components within normal limits  BRAIN NATRIURETIC PEPTIDE - Abnormal; Notable for the following components:   B Natriuretic Peptide 684.0 (*)    All other components within normal limits  TSH  URINE DRUG SCREEN, QUALITATIVE ([facility] ONLY)  POC URINE PREG, ED  POCT PREGNANCY, URINE   ____________________________________________  EKG  ED ECG REPORT I, Nita Sickle, the attending physician, personally viewed and interpreted this ECG.  Sinus tachycardia, rate of 119, normal intervals, normal axis, no ST elevations or depressions.  No prior for comparison. ____________________________________________  RADIOLOGY  I have personally reviewed the images performed during this visit and I agree with the Radiologist's read.   Interpretation by Radiologist:  Dg Chest 2 View  Result Date: 07/06/2017 CLINICAL DATA:  Chest pain and pressure for several hours, initial encounter EXAM: CHEST - 2 VIEW COMPARISON:  07/06/2017 FINDINGS: Cardiac shadow is within normal limits. Some mild increased interstitial changes are noted with some mild right  basilar atelectasis projecting in the right middle lobe. No sizable effusion is seen. No bony abnormality is noted. IMPRESSION: Mild right basilar atelectasis. Mild interstitial changes are noted likely related to bronchitic change. Electronically Signed   By: Alcide Clever M.D.   On: 07/06/2017 11:28   Ct Angio Chest Pe W And/or Wo Contrast  Result Date: 07/06/2017 CLINICAL DATA:  27 year old female with a history tachycardia. History of recent pregnancy/postpartum EXAM: CT ANGIOGRAPHY CHEST WITH CONTRAST TECHNIQUE: Multidetector CT imaging of the chest was performed using the standard protocol during bolus administration of intravenous contrast. Multiplanar CT image reconstructions and MIPs were obtained to evaluate the vascular anatomy. CONTRAST:  75mL ISOVUE-370 IOPAMIDOL (ISOVUE-370) INJECTION 76% COMPARISON:  Chest x-ray 07/06/2017 FINDINGS: Cardiovascular: Heart: No cardiomegaly. No pericardial fluid/thickening. No significant coronary calcifications. Aorta: Unremarkable course, caliber, contour of the thoracic aorta. No aneurysm or dissection flap. No periaortic fluid. Pulmonary arteries: No central, lobar, segmental, or proximal subsegmental filling defects. Mediastinum/Nodes: No mediastinal adenopathy. Unremarkable appearance of the thoracic esophagus.  Unremarkable appearance of the thoracic inlet and thyroid. Lungs/Pleura: Interlobular septal thickening with thickening of the fissures. Small bilateral pleural effusions. Peribronchial thickening. No pneumothorax. Upper Abdomen: No acute. Musculoskeletal: No acute displaced fracture. Degenerative changes of the spine. Review of the MIP images confirms the above findings. IMPRESSION: Pulmonary edema and bilateral small pleural effusions. 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 Electronically Signed   By: Gilmer Mor D.O.   On: 07/06/2017 12:58       ____________________________________________   PROCEDURES  Procedure(s) performed: None Procedures Critical Care performed:  None ____________________________________________   INITIAL IMPRESSION / ASSESSMENT AND PLAN / ED COURSE   27 y.o. female no significant past medical history who presents for evaluation of palpitations and shortness of breath and CP.  Patient is well-appearing, no distress, she is tachycardic with normal blood pressure, normal work of breathing, lungs are clear, normal sats, patient is euvolemic with no pitting edema no elevated JVD.  CT angiogram was done to rule out PE.  Negative for PE however did show bilateral small pleural effusions and pulmonary edema.  Due to recent spontaneous vaginal delivery her presentation is concerning for postpartum cardiomyopathy.  She has a troponin that is elevated at 0.05.  BNP elevated at 684. Creatinine stable at 1.27. EKG shows no ischemic changes.  Patient was given 20 mg of IV Lasix.   patient is breast-feeding and I discussed with Dr. Tiburcio Pea, OB/GYN who told me it is safe for patient to continue to breast-feed while on Lasix.  I spoke with [facility] cardiology and patient was accepted by Dr. Jacquelyne Balint for admission at [facility]. Patient awaiting bed assignment.      As part of my medical decision making, I reviewed the following data within the electronic MEDICAL RECORD NUMBER Nursing notes reviewed and incorporated, Labs reviewed , EKG interpreted , Old chart reviewed, Radiograph reviewed , A consult was requested and obtained from this/these consultant(s) [facility] Cardiology, Notes from prior ED visits and [place] Controlled Substance Database    Pertinent labs & imaging results that were available during my care of the patient were reviewed by me and considered in my medical decision making (see chart for details).    ____________________________________________   FINAL CLINICAL IMPRESSION(S) / ED DIAGNOSES  Final diagnoses:  Postpartum  cardiomyopathy      NEW MEDICATIONS STARTED DURING THIS VISIT:  ED Discharge Orders    None       Note:  This document was prepared using Dragon voice recognition software and may include unintentional dictation errors.    Don Perking, [place], MD 07/06/17 1450

## 2017-07-06 NOTE — ED Triage Notes (Signed)
Feels like heart has been racing for couple weeks. Went to PCP yesterday, has cardiology appt tomorrow but started with chest pain.  Pain is left chest and intermittent.  Also c/o SHOB over last couple weeks. Unlabored. Mild ST but otherwise VSS. Color WNl.

## 2017-07-06 NOTE — ED Notes (Signed)
MD Veronese at bedside  

## 2017-07-06 NOTE — ED Notes (Signed)
[facility] contacted for transfer/ Waiting on call back

## 2017-07-06 NOTE — ED Notes (Signed)
Date and time results received: 07/06/17 1121 (use smartphrase ".now" to insert current time)  Test: troponin Critical Value: 0.05  Name of Provider Notified: charge RN, bill

## 2017-07-07 ENCOUNTER — Other Ambulatory Visit: Payer: Self-pay

## 2017-07-07 ENCOUNTER — Inpatient Hospital Stay
Admit: 2017-07-07 | Discharge: 2017-07-07 | Disposition: A | Discharge status: Home / Self Care | Payer: Medicaid Other | Attending: Internal Medicine | Admitting: Internal Medicine

## 2017-07-07 DIAGNOSIS — I340 Nonrheumatic mitral (valve) insufficiency: Secondary | ICD-10-CM | POA: Diagnosis present

## 2017-07-07 DIAGNOSIS — I429 Cardiomyopathy, unspecified: Secondary | ICD-10-CM | POA: Diagnosis not present

## 2017-07-07 DIAGNOSIS — J810 Acute pulmonary edema: Secondary | ICD-10-CM | POA: Diagnosis present

## 2017-07-07 DIAGNOSIS — N179 Acute kidney failure, unspecified: Secondary | ICD-10-CM | POA: Diagnosis present

## 2017-07-07 DIAGNOSIS — I248 Other forms of acute ischemic heart disease: Secondary | ICD-10-CM | POA: Diagnosis present

## 2017-07-07 LAB — BASIC METABOLIC PANEL
Anion gap: 5 (ref 5–15)
BUN: 21 mg/dL — AB (ref 6–20)
CO2: 26 mmol/L (ref 22–32)
Calcium: 8.4 mg/dL — ABNORMAL LOW (ref 8.9–10.3)
Chloride: 107 mmol/L (ref 101–111)
Creatinine, Ser: 1.07 mg/dL — ABNORMAL HIGH (ref 0.44–1.00)
GFR calc non Af Amer: >60 mL/min (ref >60)
Glucose, Bld: 110 mg/dL — ABNORMAL HIGH (ref 65–99)
POTASSIUM: 3.6 mmol/L (ref 3.5–5.1)
SODIUM: 138 mmol/L (ref 135–145)

## 2017-07-07 LAB — TROPONIN I
TROPONIN I: 0.06 ng/mL — AB (ref <0.03)
Troponin I: 0.04 ng/mL (ref <0.03)
Troponin I: 0.05 ng/mL (ref <0.03)

## 2017-07-07 LAB — BRAIN NATRIURETIC PEPTIDE: B NATRIURETIC PEPTIDE 5: 502 pg/mL — AB (ref 0.0–100.0)

## 2017-07-07 MED ORDER — FUROSEMIDE 10 MG/ML IJ SOLN
20.0000 mg | Freq: Once | INTRAMUSCULAR | Status: AC
  Administered 2017-07-07: 20 mg via INTRAVENOUS
  Filled 2017-07-07: qty 4

## 2017-07-07 MED ORDER — ACETAMINOPHEN 650 MG RE SUPP: 650.0000 mg | Freq: Four times a day (QID) | RECTAL | Status: DC | PRN

## 2017-07-07 MED ORDER — SENNOSIDES-DOCUSATE SODIUM 8.6-50 MG PO TABS: 1.0000 | ORAL_TABLET | Freq: Every evening | ORAL | Status: DC | PRN

## 2017-07-07 MED ORDER — SODIUM CHLORIDE 0.9 % IV SOLN: 250.0000 mL | INTRAVENOUS | Status: DC | PRN

## 2017-07-07 MED ORDER — BISACODYL 5 MG PO TBEC: 5.0000 mg | DELAYED_RELEASE_TABLET | Freq: Every day | ORAL | Status: DC | PRN

## 2017-07-07 MED ORDER — SODIUM CHLORIDE 0.9% FLUSH: 3.0000 mL | INTRAVENOUS | Status: DC | PRN

## 2017-07-07 MED ORDER — ONDANSETRON HCL 4 MG PO TABS: 4.0000 mg | ORAL_TABLET | Freq: Four times a day (QID) | ORAL | Status: DC | PRN

## 2017-07-07 MED ORDER — PRENATAL PLUS 27-1 MG PO TABS
1.0000 | ORAL_TABLET | Freq: Every day | ORAL | Status: DC
  Filled 2017-07-07: qty 1

## 2017-07-07 MED ORDER — ACETAMINOPHEN 325 MG PO TABS: 650.0000 mg | ORAL_TABLET | Freq: Four times a day (QID) | ORAL | Status: DC | PRN

## 2017-07-07 MED ORDER — ONDANSETRON HCL 4 MG/2ML IJ SOLN: 4.0000 mg | Freq: Four times a day (QID) | INTRAMUSCULAR | Status: DC | PRN

## 2017-07-07 MED ORDER — HYDROCODONE-ACETAMINOPHEN 5-325 MG PO TABS: 1.0000 | ORAL_TABLET | ORAL | Status: DC | PRN

## 2017-07-07 MED ORDER — ENOXAPARIN SODIUM 40 MG/0.4ML ~~LOC~~ SOLN
40.0000 mg | SUBCUTANEOUS | Status: DC
  Administered 2017-07-07: 40 mg via SUBCUTANEOUS
  Filled 2017-07-07: qty 0.4

## 2017-07-07 MED ORDER — ACETAMINOPHEN 500 MG PO TABS
1000.0000 mg | ORAL_TABLET | Freq: Once | ORAL | Status: AC
  Administered 2017-07-07: 1000 mg via ORAL
  Filled 2017-07-07: qty 2

## 2017-07-07 MED ORDER — SODIUM CHLORIDE 0.9% FLUSH
3.0000 mL | Freq: Two times a day (BID) | INTRAVENOUS | Status: DC
  Administered 2017-07-07: 3 mL via INTRAVENOUS

## 2017-07-07 NOTE — Progress Notes (Signed)
Patient admitted from ER with possible postpartum cardiomyopathy.  Patient was in the ED for approximately 30 hours waiting for transfer to [facility].  No beds were available so she was admitted to [place].  Assessment WNL except for mild tachycardia, and coarse breath sounds.  She says she misses her kids, but is other wise fine with being admitted.

## 2017-07-07 NOTE — ED Notes (Signed)
Pt given water and cherry popsicle. Pt resting and is in NAD at this time.

## 2017-07-07 NOTE — ED Notes (Signed)
Sleeping.  In nad. 

## 2017-07-07 NOTE — ED Notes (Signed)
Called to check on status, [facility] informed me they were still on list and waiting for bed  1357

## 2017-07-07 NOTE — ED Provider Notes (Signed)
Patient awaiting transfer for possible cardiomyopathy of pregnancy. Patient is remaining stable with a heart rate has come up here recently. We'll continue to monitor her.   Arnaldo Natal, MD 07/07/17 [phone]

## 2017-07-07 NOTE — H&P (Addendum)
[employer] [facility] [place] [facility]   PATIENT NAME: Allison Adams    MR#:  [id number]  DATE OF BIRTH:  [birth year]  DATE OF ADMISSION:  07/06/2017  PRIMARY CARE PHYSICIAN: [place]   REQUESTING/REFERRING PHYSICIAN: dr Marisa Severin  CHIEF COMPLAINT:   sob HISTORY OF PRESENT ILLNESS:  Myeshia Fojtik  is a 27 y.o. female with no past medical history who presented to the emergency room yesterday complaining of shortness of breath and dyspnea exertion.  CT chest showed pulmonary edema concerning for postpartum cardiomyopathy.  She delivered a healthy baby December 18.  She reports 2 weeks of shortness of breath and dyspnea exertion.  Initial plan was for patient to be transferred to [facility] where she delivered her baby however due to the hospital being full she will be admitted to [facility].  ED physician has spoken with [facility] cardiologist on-call.  PAST MEDICAL HISTORY:  None  PAST SURGICAL HISTORY:   Past Surgical History:  Procedure Laterality Date  . CESAREAN SECTION      SOCIAL HISTORY:   Social History   Tobacco Use  . Smoking status: Never Smoker  . Smokeless tobacco: Never Used  Substance Use Topics  . Alcohol use: No    FAMILY HISTORY:   Family History  Problem Relation Age of Onset  . Diabetes Mother   . Lupus Mother     DRUG ALLERGIES:  No Known Allergies  REVIEW OF SYSTEMS:   Review of Systems  Constitutional: Negative.  Negative for chills, fever and malaise/fatigue.  HENT: Negative.  Negative for ear discharge, ear pain, hearing loss, nosebleeds and sore throat.   Eyes: Negative.  Negative for blurred vision and pain.  Respiratory: Positive for shortness of breath. Negative for cough, hemoptysis and wheezing.   Cardiovascular: Negative.  Negative for chest pain, palpitations and leg swelling.  Gastrointestinal: Negative.  Negative for abdominal pain, blood in stool, diarrhea, nausea and vomiting.  Genitourinary: Negative.  Negative  for dysuria.  Musculoskeletal: Negative.  Negative for back pain.  Skin: Negative.   Neurological: Negative for dizziness, tremors, speech change, focal weakness, seizures and headaches.  Endo/Heme/Allergies: Negative.  Does not bruise/bleed easily.  Psychiatric/Behavioral: Negative.  Negative for depression, hallucinations and suicidal ideas.    MEDICATIONS AT HOME:   Prior to Admission medications   Medication Sig Start Date End Date Taking? Authorizing Provider  prenatal vitamin w/FE, FA (PRENATAL 1 + 1) 27-1 MG TABS tablet Take 1 tablet by mouth daily at 12 noon.   Yes [provider]      VITAL SIGNS:  Blood pressure 102/81, pulse (!) 104, temperature 98 F (36.7 C), temperature source Oral, resp. rate 14, height  (1.499 m), weight 61.2 kg (135 lb), last menstrual period 07/04/2017, SpO2 100 %, currently breastfeeding.  PHYSICAL EXAMINATION:   Physical Exam  Constitutional: She is oriented to person, place, and time. No distress.  HENT:  Head: Normocephalic.  Eyes: No scleral icterus.  Neck: Normal range of motion. Neck supple. No JVD present. No tracheal deviation present.  Cardiovascular: Normal rate, regular rhythm and normal heart sounds. Exam reveals no gallop and no friction rub.  No murmur heard. Pulmonary/Chest: Effort normal and breath sounds normal. No respiratory distress. She has no wheezes. She has no rales. She exhibits no tenderness.  Abdominal: Soft. Bowel sounds are normal. She exhibits no distension and no mass. There is no tenderness. There is no rebound and no guarding.  Musculoskeletal: Normal range of motion. She  exhibits no edema.  Neurological: She is alert and oriented to person, place, and time.  Skin: Skin is warm. No rash noted. No erythema.  Psychiatric: She has a normal mood and affect. Judgment normal.      LABORATORY PANEL:   CBC Recent Labs  Lab 07/06/17 1048  WBC 7.1  HGB 12.7  HCT 37.6  PLT 320    ------------------------------------------------------------------------------------------------------------------  Chemistries  Recent Labs  Lab 07/07/17 1605  NA 138  K 3.6  CL 107  CO2 26  GLUCOSE 110*  BUN 21*  CREATININE 1.07*  CALCIUM 8.4*   ------------------------------------------------------------------------------------------------------------------  Cardiac Enzymes Recent Labs  Lab 07/06/17 1518  TROPONINI 0.04*   ------------------------------------------------------------------------------------------------------------------  RADIOLOGY:  Dg Chest 2 View  Result Date: 07/06/2017 CLINICAL DATA:  Chest pain and pressure for several hours, initial encounter EXAM: CHEST - 2 VIEW COMPARISON:  07/06/2017 FINDINGS: Cardiac shadow is within normal limits. Some mild increased interstitial changes are noted with some mild right basilar atelectasis projecting in the right middle lobe. No sizable effusion is seen. No bony abnormality is noted. IMPRESSION: Mild right basilar atelectasis. Mild interstitial changes are noted likely related to bronchitic change. Electronically Signed   By: Alcide Clever M.D.   On: 07/06/2017 11:28   Ct Angio Chest Pe W And/or Wo Contrast  Result Date: 07/06/2017 CLINICAL DATA:  27 year old female with a history tachycardia. History of recent pregnancy/postpartum EXAM: CT ANGIOGRAPHY CHEST WITH CONTRAST TECHNIQUE: Multidetector CT imaging of the chest was performed using the standard protocol during bolus administration of intravenous contrast. Multiplanar CT image reconstructions and MIPs were obtained to evaluate the vascular anatomy. CONTRAST:  75mL ISOVUE-370 IOPAMIDOL (ISOVUE-370) INJECTION 76% COMPARISON:  Chest x-ray 07/06/2017 FINDINGS: Cardiovascular: Heart: No cardiomegaly. No pericardial fluid/thickening. No significant coronary calcifications. Aorta: Unremarkable course, caliber, contour of the thoracic aorta. No aneurysm or dissection flap.  No periaortic fluid. Pulmonary arteries: No central, lobar, segmental, or proximal subsegmental filling defects. Mediastinum/Nodes: No mediastinal adenopathy. Unremarkable appearance of the thoracic esophagus. Unremarkable appearance of the thoracic inlet and thyroid. Lungs/Pleura: Interlobular septal thickening with thickening of the fissures. Small bilateral pleural effusions. Peribronchial thickening. No pneumothorax. Upper Abdomen: No acute. Musculoskeletal: No acute displaced fracture. Degenerative changes of the spine. Review of the MIP images confirms the above findings. IMPRESSION: Pulmonary edema and bilateral small pleural effusions. 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 Electronically Signed   By: Gilmer Mor D.O.   On: 07/06/2017 12:58    EKG:   Sinus rhythm no ST elevation or depression IMPRESSION AND PLAN:   27 year old gravida 2 para 2 female who presents to the emergency room with shortness of breath.  1.  Pulmonary edema concerning for postpartum cardiomyopathy which is etiology of shortness of breath Patient has received Lasix with good response Monitor in a.m. for need for more Lasix Echocardiogram ordered [facility] cardiology consultation requested Monitor electrolytes BNP has responded to Lasix.  BNP has decreased to 502 from 684   2.  Elevated troponin: This is due to demand ischemia from problem #1 Continue telemetry and monitor troponins  3.  Acute kidney injury in the setting of pulmonary edema which has responded to IV Lasix as creatinine has improved    All the records are reviewed and case discussed with ED provider. Management plans discussed with the patient and she is in agreement  CODE STATUS: Full  TOTAL TIME TAKING CARE OF THIS PATIENT: 40 minutes   Levander Katzenstein,  Shalicia Craghead M.D on 07/07/2017 at 4:58 PM  Between 7am to 6pm - Pager - [phone]  After 6pm go to [email or web address] -  Social research officer, government  [facility]  Office  [phone]  CC: Primary care physician; [facility]

## 2017-07-07 NOTE — ED Notes (Signed)
Pt sleeping. 

## 2017-07-07 NOTE — ED Notes (Signed)
Date and time results received: 07/07/17 1733 (use smartphrase ".now" to insert current time)  Test: Troponin Critical Value: 0.04  Name of Provider Notified: Dr. Marisa Severin  Orders Received? Or Actions Taken?:

## 2017-07-07 NOTE — ED Notes (Signed)
Called [facility] transfer center, [facility], informed patient on list, they would be waiting for discharges 0710

## 2017-07-07 NOTE — ED Provider Notes (Signed)
-----------------------------------------   4:55 PM on 07/07/2017 -----------------------------------------  I took over care on this patient from the previous providers.  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  The patient had been boarding in the ED for over 24 hours and the latest update was that there was still no bed availability at [facility].  I reassessed the patient, and she has remained clinically stable.  She still has exertional shortness of breath and some tachycardia.  She has not received medication or repeat labs since her initial evaluation yesterday.  I called the cardiology fellow at [facility], Dr. Maple Hudson, in order to get advice on how to further manage the patient during her boarding.  He recommended to get an echocardiogram, IV Lasix twice daily, and repeat electrolytes with each dose of Lasix.  He also advised that  it might be productive to admit the patient here while awaiting transfer to further expedite her care.  I discussed this with the patient and she agreed with being admitted here.  I ordered repeat labs and another dose of Lasix.  I consulted Dr. Gwen Pounds from cardiology.  The patient had actually been referred to see [facility] clinic cardiology prior to coming to the hospital, but had not been to the appointment yet.  Dr. Gwen Pounds kindly agrees to consult on the patient and agreed with admission here.  I discussed the case with the hospitalist Dr. Tildon Husky, who also kindly  Agreed to accept the patient here.  We will contact [facility] to notify them of the admission here although we will leave the patient on the wait list for transfer.   Dionne Bucy, MD 07/07/17 1659

## 2017-07-07 NOTE — ED Notes (Signed)
Informed Roger at [facility] patient to be admitted here and to keep patient on the wait list.  1700

## 2017-07-07 NOTE — ED Notes (Signed)
Report to kala rn

## 2017-07-07 NOTE — ED Notes (Signed)
Patient has her baby with her.  Patient in nad.

## 2017-07-07 NOTE — Progress Notes (Signed)
*  PRELIMINARY RESULTS* Echocardiogram 2D Echocardiogram has been performed.  Garrel Ridgel Atoya Andrew 07/07/2017, 8:12 PM

## 2017-07-07 NOTE — ED Notes (Signed)
I gave patient lunch tray.

## 2017-07-07 NOTE — ED Notes (Signed)
Called to give report to floor. RN was busy and will call back

## 2017-07-07 NOTE — ED Notes (Signed)
Given breakfast tray and extra apple juice

## 2017-07-08 MED ORDER — ASPIRIN 325 MG PO TBEC: 325.0000 mg | DELAYED_RELEASE_TABLET | Freq: Every day | ORAL | 0 refills | Status: AC

## 2017-07-08 MED ORDER — ONDANSETRON HCL 4 MG/2ML IJ SOLN: 4.0000 mg | Freq: Four times a day (QID) | INTRAMUSCULAR | 0 refills | Status: DC | PRN

## 2017-07-08 MED ORDER — BISACODYL 5 MG PO TBEC: 5.0000 mg | DELAYED_RELEASE_TABLET | Freq: Every day | ORAL | 0 refills | Status: DC | PRN

## 2017-07-08 MED ORDER — ACETAMINOPHEN 325 MG PO TABS: 650.0000 mg | ORAL_TABLET | Freq: Four times a day (QID) | ORAL | 0 refills | Status: DC | PRN

## 2017-07-08 MED ORDER — SENNOSIDES-DOCUSATE SODIUM 8.6-50 MG PO TABS: 1.0000 | ORAL_TABLET | Freq: Every evening | ORAL | 0 refills | Status: DC | PRN

## 2017-07-08 MED ORDER — ONDANSETRON HCL 4 MG PO TABS: 4.0000 mg | ORAL_TABLET | Freq: Four times a day (QID) | ORAL | 0 refills | Status: DC | PRN

## 2017-07-08 MED ORDER — ASPIRIN EC 325 MG PO TBEC: 325.0000 mg | DELAYED_RELEASE_TABLET | Freq: Every day | ORAL | Status: DC

## 2017-07-08 MED ORDER — HYDROCODONE-ACETAMINOPHEN 5-325 MG PO TABS: 1.0000 | ORAL_TABLET | ORAL | 0 refills | Status: DC | PRN

## 2017-07-08 NOTE — Progress Notes (Signed)
Ems to pick up pt. Telemetry removed, vs taken, with no complaints of pain at this time.

## 2017-07-08 NOTE — Progress Notes (Signed)
Notified pt of transfer to [facility]. Report called to Daneil Dan at [facility]. Awaiting EMS

## 2017-07-08 NOTE — Plan of Care (Signed)
  Problem: Clinical Measurements: Goal: Diagnostic test results will improve Outcome: Progressing Goal: Respiratory complications will improve Outcome: Progressing   Problem: Education: Goal: Knowledge of disease or condition will improve Outcome: Progressing   Problem: Cardiac: Goal: Ability to achieve and maintain adequate cardiopulmonary perfusion will improve Outcome: Progressing

## 2017-07-08 NOTE — Discharge Summary (Signed)
[facility] - [place] at [facility]   PATIENT NAME: Allison Adams    MR#:  [id number]  DATE OF BIRTH:  Nov 08, 1990  DATE OF ADMISSION:  07/06/2017  PRIMARY CARE PHYSICIAN: Dan Humphreys, [facility]   REQUESTING/REFERRING PHYSICIAN:   CHIEF COMPLAINT:   Chief Complaint  Patient presents with  . Palpitations    HISTORY OF PRESENT ILLNESS: Allison Adams  is a 27 y.o. female without significant medical history, status post recent delivery. Patient was admitted for shortness of breath, lower extremities edema and generalized weakness she was diagnosed with postpartum cardiomyopathy. Echocardiogram showed ejection fraction at 20%, moderate to severe mitral regurgitation and moderate left ventricular dilatation.  Troponin levels are elevated at 0.06.  Chest CT shows pulmonary edema. Patient was treated with Lasix with some improvement in her symptoms.  PAST MEDICAL HISTORY:  History reviewed. No pertinent past medical history.  PAST SURGICAL HISTORY:  Past Surgical History:  Procedure Laterality Date  . CESAREAN SECTION      SOCIAL HISTORY:  Social History   Tobacco Use  . Smoking status: Never Smoker  . Smokeless tobacco: Never Used  Substance Use Topics  . Alcohol use: No    FAMILY HISTORY:  Family History  Problem Relation Age of Onset  . Diabetes Mother   . Lupus Mother     DRUG ALLERGIES: No Known Allergies  REVIEW OF SYSTEMS:   CONSTITUTIONAL: No fever, patient complains of fatigue and generalized weakness.  EYES: No blurred or double vision.  EARS, NOSE, AND THROAT: No tinnitus or ear pain.  RESPIRATORY: Positive for shortness of breath.  No cough, wheezing or hemoptysis.  CARDIOVASCULAR: No chest pain, but positive for orthopnea and edema.  GASTROINTESTINAL: No nausea, vomiting, diarrhea or abdominal pain.  GENITOURINARY: No dysuria, hematuria.  ENDOCRINE: No polyuria, nocturia,  HEMATOLOGY: No anemia, easy bruising or  bleeding SKIN: No rash or lesion. MUSCULOSKELETAL: No joint pain or arthritis.   NEUROLOGIC: No focal weakness.  PSYCHIATRY: No anxiety or depression.   MEDICATIONS AT HOME:  Prior to Admission medications   Medication Sig Start Date End Date Taking? Authorizing Provider  prenatal vitamin w/FE, FA (PRENATAL 1 + 1) 27-1 MG TABS tablet Take 1 tablet by mouth daily at 12 noon.   Yes [provider]  acetaminophen (TYLENOL) 325 MG tablet Take 2 tablets (650 mg total) by mouth every 6 (six) hours as needed for mild pain (or Fever >/= 101). 07/08/17   Cammy Copa, MD  bisacodyl (DULCOLAX) 5 MG EC tablet Take 1 tablet (5 mg total) by mouth daily as needed for moderate constipation. 07/08/17   Cammy Copa, MD  HYDROcodone-acetaminophen (NORCO/VICODIN) 5-325 MG tablet Take 1-2 tablets by mouth every 4 (four) hours as needed for moderate pain. 07/08/17   Cammy Copa, MD  ondansetron (ZOFRAN) 4 MG tablet Take 1 tablet (4 mg total) by mouth every 6 (six) hours as needed for nausea. 07/08/17   Cammy Copa, MD  ondansetron [facility]) 4 MG/2ML SOLN injection Inject 2 mLs (4 mg total) into the vein every 6 (six) hours as needed for nausea. 07/08/17   Cammy Copa, MD  senna-docusate (SENOKOT-S) 8.6-50 MG tablet Take 1 tablet by mouth at bedtime as needed for mild constipation. 07/08/17   Cammy Copa, MD      PHYSICAL EXAMINATION:   VITAL SIGNS: Blood pressure 107/77, pulse (!) 107, temperature 98.6 F (37 C), resp. rate 20, height  (1.499 m), weight 58.8 kg (129 lb 9.6 oz), last  menstrual period 07/04/2017, SpO2 97 %, currently breastfeeding.  GENERAL:  27 y.o.-year-old patient lying in the bed with no acute distress.  EYES: Pupils equal, round, reactive to light and accommodation. No scleral icterus. Extraocular muscles intact.  HEENT: Head atraumatic, normocephalic. Oropharynx and nasopharynx clear.  NECK:  Supple, no jugular venous distention. No thyroid enlargement, no tenderness.   LUNGS: Reduced breath sounds bilaterally.  Bibasilar crackles are heard.  No use of accessory muscles of respiration.  CARDIOVASCULAR: S1, S2 normal. No S3/S4.  ABDOMEN: Soft, nontender, nondistended. Bowel sounds present. No organomegaly or mass.  EXTREMITIES: 1+ bilateral pedal edema, cyanosis, or clubbing.  NEUROLOGIC: Cranial nerves II through XII are intact. Muscle strength 5/5 in all extremities. Sensation intact. Gait not checked.  PSYCHIATRIC: The patient is alert and oriented x 3.  SKIN: No obvious rash, lesion, or ulcer.   LABORATORY PANEL:   CBC Recent Labs  Lab 07/06/17 1048  WBC 7.1  HGB 12.7  HCT 37.6  PLT 320  MCV 87.8  MCH 29.6  MCHC 33.7  RDW 15.5*   ------------------------------------------------------------------------------------------------------------------  Chemistries  Recent Labs  Lab 07/06/17 1048 07/07/17 1605  NA 137 138  K 3.8 3.6  CL 107 107  CO2 22 26  GLUCOSE 96 110*  BUN 29* 21*  CREATININE 1.27* 1.07*  CALCIUM 8.3* 8.4*   ------------------------------------------------------------------------------------------------------------------ estimated creatinine clearance is 61.6 mL/min (A) (by C-G formula based on SCr of 1.07 mg/dL (H)). ------------------------------------------------------------------------------------------------------------------ Recent Labs    07/06/17 1048  TSH 3.691     Coagulation profile No results for input(s): INR, PROTIME in the last 168 hours. ------------------------------------------------------------------------------------------------------------------- No results for input(s): DDIMER in the last 72 hours. -------------------------------------------------------------------------------------------------------------------  Cardiac Enzymes Recent Labs  Lab 07/07/17 1605 07/07/17 1709 07/07/17 2302  TROPONINI 0.04* 0.05* 0.06*    ------------------------------------------------------------------------------------------------------------------ Invalid input(s): POCBNP  ---------------------------------------------------------------------------------------------------------------  Urinalysis    Component Value Date/Time   COLORURINE YELLOW 11/07/2016 1235   APPEARANCEUR CLEAR 11/07/2016 1235   LABSPEC 1.015 11/07/2016 1235   PHURINE 7.0 11/07/2016 1235   GLUCOSEU NEGATIVE 11/07/2016 1235   HGBUR TRACE (A) 11/07/2016 1235   BILIRUBINUR NEGATIVE 11/07/2016 1235   KETONESUR NEGATIVE 11/07/2016 1235   PROTEINUR NEGATIVE 11/07/2016 1235   NITRITE NEGATIVE 11/07/2016 1235   LEUKOCYTESUR NEGATIVE 11/07/2016 1235     RADIOLOGY: Dg Chest 2 View  Result Date: 07/06/2017 CLINICAL DATA:  Chest pain and pressure for several hours, initial encounter EXAM: CHEST - 2 VIEW COMPARISON:  07/06/2017 FINDINGS: Cardiac shadow is within normal limits. Some mild increased interstitial changes are noted with some mild right basilar atelectasis projecting in the right middle lobe. No sizable effusion is seen. No bony abnormality is noted. IMPRESSION: Mild right basilar atelectasis. Mild interstitial changes are noted likely related to bronchitic change. Electronically Signed   By: Alcide Clever M.D.   On: 07/06/2017 11:28   Ct Angio Chest Pe W And/or Wo Contrast  Result Date: 07/06/2017 CLINICAL DATA:  27 year old female with a history tachycardia. History of recent pregnancy/postpartum EXAM: CT ANGIOGRAPHY CHEST WITH CONTRAST TECHNIQUE: Multidetector CT imaging of the chest was performed using the standard protocol during bolus administration of intravenous contrast. Multiplanar CT image reconstructions and MIPs were obtained to evaluate the vascular anatomy. CONTRAST:  75mL ISOVUE-370 IOPAMIDOL (ISOVUE-370) INJECTION 76% COMPARISON:  Chest x-ray 07/06/2017 FINDINGS: Cardiovascular: Heart: No cardiomegaly. No pericardial fluid/thickening.  No significant coronary calcifications. Aorta: Unremarkable course, caliber, contour of the thoracic aorta. No aneurysm or dissection flap. No periaortic fluid. Pulmonary arteries: No  central, lobar, segmental, or proximal subsegmental filling defects. Mediastinum/Nodes: No mediastinal adenopathy. Unremarkable appearance of the thoracic esophagus. Unremarkable appearance of the thoracic inlet and thyroid. Lungs/Pleura: Interlobular septal thickening with thickening of the fissures. Small bilateral pleural effusions. Peribronchial thickening. No pneumothorax. Upper Abdomen: No acute. Musculoskeletal: No acute displaced fracture. Degenerative changes of the spine. Review of the MIP images confirms the above findings. IMPRESSION: Pulmonary edema and bilateral small pleural effusions. 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 Electronically Signed   By: Gilmer Mor D.O.   On: 07/06/2017 12:58    EKG: Orders placed or performed during the hospital encounter of 07/06/17  . EKG 12-Lead  . EKG 12-Lead  . ED EKG within 10 minutes  . ED EKG within 10 minutes    IMPRESSION AND PLAN: 1.  Acute pulmonary edema, secondary to postpartum cardiomyopathy, started on Lasix IV. 2.  Acute postpartum cardiomyopathy. Per Cardiology, EF is 20%. Will transfer to [facility] for further evaluation and treatment. 3.  Moderate to severe mitral regurgitation.  We will continue management per cardiology. 4.  Elevated troponin level, likely secondary demand ischemia, from acute pulmonary edema/cardiomyopathy.  Continue to monitor patient on telemetry.  Continue aspirin.  All the records are reviewed and case discussed with Cardiology provider at [facility] and [facility]. Management plans discussed with the patient and she is in agreement.  CODE STATUS: FULL    Code Status Orders  (From admission, onward)        Start     Ordered   07/07/17 1807  Full code   Continuous     07/07/17 1806    Code Status History    This patient has a current code status but no historical code status.       TOTAL TIME TAKING CARE OF THIS PATIENT: 40 minutes.    Cammy Copa M.D on 07/08/2017 at 1:26 AM  Between 7am to 6pm - Pager - [phone]  After 6pm go to [email or web address] - password [email or web address] [facility]  [place] [facility]  Office  [phone]  CC: Primary care physician; [facility]

## 2017-07-09 LAB — ECHOCARDIOGRAM COMPLETE
HEIGHTINCHES: 59 in
Weight: 2073.6 oz

## 2017-07-09 LAB — HIV ANTIBODY (ROUTINE TESTING W REFLEX): HIV Screen 4th Generation wRfx: NONREACTIVE

## 2017-07-22 ENCOUNTER — Emergency Department: Payer: Medicaid Other

## 2017-07-22 ENCOUNTER — Other Ambulatory Visit: Payer: Self-pay

## 2017-07-22 ENCOUNTER — Emergency Department
Admission: EM | Admit: 2017-07-22 | Discharge: 2017-07-22 | Disposition: A | Discharge status: Home / Self Care | Payer: Medicaid Other | Attending: Emergency Medicine | Admitting: Emergency Medicine

## 2017-07-22 DIAGNOSIS — R05 Cough: Secondary | ICD-10-CM | POA: Diagnosis not present

## 2017-07-22 DIAGNOSIS — R059 Cough, unspecified: Secondary | ICD-10-CM

## 2017-07-22 DIAGNOSIS — Z79899 Other long term (current) drug therapy: Secondary | ICD-10-CM | POA: Diagnosis not present

## 2017-07-22 DIAGNOSIS — R0602 Shortness of breath: Secondary | ICD-10-CM | POA: Diagnosis not present

## 2017-07-22 DIAGNOSIS — R079 Chest pain, unspecified: Secondary | ICD-10-CM

## 2017-07-22 DIAGNOSIS — O903 Peripartum cardiomyopathy: Secondary | ICD-10-CM | POA: Diagnosis not present

## 2017-07-22 LAB — CBC
HCT: 37.8 % (ref 35.0–47.0)
HEMOGLOBIN: 12.7 g/dL (ref 12.0–16.0)
MCH: 29.5 pg (ref 26.0–34.0)
MCHC: 33.6 g/dL (ref 32.0–36.0)
MCV: 87.8 fL (ref 80.0–100.0)
Platelets: 219 10*3/uL (ref 150–440)
RBC: 4.3 MIL/uL (ref 3.80–5.20)
RDW: 15.5 % — ABNORMAL HIGH (ref 11.5–14.5)
WBC: 6.8 10*3/uL (ref 3.6–11.0)

## 2017-07-22 LAB — TROPONIN I: Troponin I: <0.03 ng/mL (ref <0.03)

## 2017-07-22 LAB — BASIC METABOLIC PANEL
ANION GAP: 7 (ref 5–15)
BUN: 20 mg/dL (ref 6–20)
CO2: 23 mmol/L (ref 22–32)
Calcium: 8 mg/dL — ABNORMAL LOW (ref 8.9–10.3)
Chloride: 109 mmol/L (ref 101–111)
Creatinine, Ser: 1 mg/dL (ref 0.44–1.00)
GFR calc Af Amer: >60 mL/min (ref >60)
Glucose, Bld: 106 mg/dL — ABNORMAL HIGH (ref 65–99)
POTASSIUM: 3.2 mmol/L — AB (ref 3.5–5.1)
SODIUM: 139 mmol/L (ref 135–145)

## 2017-07-22 LAB — BRAIN NATRIURETIC PEPTIDE: B NATRIURETIC PEPTIDE 5: 589 pg/mL — AB (ref 0.0–100.0)

## 2017-07-22 MED ORDER — FUROSEMIDE 10 MG/ML IJ SOLN
20.0000 mg | Freq: Once | INTRAMUSCULAR | Status: AC
  Administered 2017-07-22: 20 mg via INTRAVENOUS
  Filled 2017-07-22: qty 4

## 2017-07-22 NOTE — ED Notes (Signed)
Patient transported to X-ray 

## 2017-07-22 NOTE — Discharge Instructions (Signed)
As we discussed, your evaluation was reassuring today.  There is no evidence that you are having severe strain on your heart today.  We think that the pain you are having is from the extra coughing causing muscle strain in your chest.  The cough may be the result of some extra "fluffiness" in your lungs, not actual fluid like when you were first diagnosed, but it should help to get some of that extra fluid out.  We gave you an extra dose of Lasix 20 mg IV, and we encourage you to continue taking all of your regular medications as scheduled.  Please follow-up with your regular doctors at the next available opportunity.  Return immediately to the emergency department if you develop any new or worsening symptoms that concern you.

## 2017-07-22 NOTE — ED Notes (Signed)
Report to megan, rn.  

## 2017-07-22 NOTE — ED Notes (Signed)
Pt adjusted in bed for comfort.  

## 2017-07-22 NOTE — ED Provider Notes (Signed)
[facility] Emergency Department Provider Note  ____________________________________________   First MD Initiated Contact with Patient 07/22/17 [phone]     (approximate)  I have reviewed the triage vital signs and the nursing notes.   HISTORY  Chief Complaint Tachycardia and Chest Pain    HPI Allison Adams is a 27 y.o. female with a complicated recent medical history of peripartum cardiomyopathy with an EF of approximately 20% and who wears a LifeVest with plans to have an ICD placed.  She presents today for evaluation of chest pain that started yesterday.  She says that it is associated with a cough that has been bothering her for a few days.  The cough makes the pain worse and almost completely goes away when she stops but she does still feel it.  She feels a little bit when she moves around as well.  She does not feel more short of breath and she has since the diagnosis of cardiomyopathy.  She has been taking her medications including her Lasix.  She has had follow-up visits and has plans for additional follow-up visits with primary care and cardiology.  She denies fever/chills, nausea, vomiting, abdominal pain, and dysuria.  No past medical history on file.  Patient Active Problem List   Diagnosis Date Noted  . Cardiomyopathy (HCC) 07/07/2017    Past Surgical History:  Procedure Laterality Date  . CESAREAN SECTION      Prior to Admission medications   Medication Sig Start Date End Date Taking? Authorizing Provider  aspirin EC 325 MG EC tablet Take 1 tablet (325 mg total) by mouth daily. 07/08/17   Barbaraann Rondo, MD  prenatal vitamin w/FE, FA (PRENATAL 1 + 1) 27-1 MG TABS tablet Take 1 tablet by mouth daily at 12 noon.    [provider]    Allergies Patient has no known allergies.  Family History  Problem Relation Age of Onset  . Diabetes Mother   . Lupus Mother     Social History Social History   Tobacco Use  . Smoking  status: Never Smoker  . Smokeless tobacco: Never Used  Substance Use Topics  . Alcohol use: No  . Drug use: No    Review of Systems Constitutional: No fever/chills Eyes: No visual changes. ENT: No sore throat. Cardiovascular: Chest pain as described above Respiratory: Chronic shortness of breath secondary to cardiomyopathy Gastrointestinal: No abdominal pain.  No nausea, no vomiting.  No diarrhea.  No constipation. Genitourinary: Negative for dysuria. Musculoskeletal: Negative for neck pain.  Negative for back pain. Integumentary: Negative for rash. Neurological: Negative for headaches, focal weakness or numbness.   ____________________________________________   PHYSICAL EXAM:  VITAL SIGNS: ED Triage Vitals  Enc Vitals Group     BP 07/22/17 0402 114/89     Pulse Rate 07/22/17 0403 (!) 123     Resp 07/22/17 0403 (!) 22     Temp 07/22/17 0403 99.4 F (37.4 C)     Temp Source 07/22/17 0403 Oral     SpO2 07/22/17 0403 96 %     Weight 07/22/17 0404 63 kg (139 lb)     Height 07/22/17 0404 1.499 m ( )     Head Circumference --      Peak Flow --      Pain Score 07/22/17 0403 5     Pain Loc --      Pain Edu? --      Excl. in GC? --     Constitutional: Alert and  oriented. Well appearing and in no acute distress. Eyes: Conjunctivae are normal.   Head: Atraumatic. Nose: No congestion/rhinnorhea. Mouth/Throat: Mucous membranes are moist. Neck: No stridor.  No meningeal signs.   Cardiovascular: Normal rate, regular rhythm. Good peripheral circulation. Grossly normal heart sounds. Respiratory: Normal respiratory effort.  No retractions. Lungs CTAB. Gastrointestinal: Soft and nontender. No distention.  Musculoskeletal: No lower extremity tenderness nor edema. No gross deformities of extremities. Neurologic:  Normal speech and language. No gross focal neurologic deficits are appreciated.  Skin:  Skin is warm, dry and intact. No rash noted. Psychiatric: Mood and affect are  normal. Speech and behavior are normal.  ____________________________________________   LABS (all labs ordered are listed, but only abnormal results are displayed)  Labs Reviewed  CBC - Abnormal; Notable for the following components:      Result Value   RDW 15.5 (*)    All other components within normal limits  BASIC METABOLIC PANEL - Abnormal; Notable for the following components:   Potassium 3.2 (*)    Glucose, Bld 106 (*)    Calcium 8.0 (*)    All other components within normal limits  BRAIN NATRIURETIC PEPTIDE - Abnormal; Notable for the following components:   B Natriuretic Peptide 589.0 (*)    All other components within normal limits  TROPONIN I  TROPONIN I   ____________________________________________  EKG  ED ECG REPORT I, Loleta Rose, the attending physician, personally viewed and interpreted this ECG.  Date: 07/22/2017 EKG Time: 4:05 AM Rate: 121 Rhythm: Sinus tachycardia QRS Axis: normal Intervals: normal ST/T Wave abnormalities: normal Narrative Interpretation: no evidence of acute ischemia  ____________________________________________  RADIOLOGY I, Loleta Rose, personally viewed and evaluated these images (plain radiographs) as part of my medical decision making, as well as reviewing the written report by the radiologist.  ED MD interpretation: Cardiac enlargement with some vascular congestion and fluid in the fissures with no consolidation or edema.  Official radiology report(s): Dg Chest 2 View  Result Date: 07/22/2017 CLINICAL DATA:  History of cardiomyopathy. Midsternal chest pain for 3 hours. Shortness of breath and dizziness. EXAM: CHEST - 2 VIEW COMPARISON:  07/06/2017 FINDINGS: Cardiac enlargement with mild central vascular congestion. No edema or consolidation. No blunting of costophrenic angles. No pneumothorax. Mediastinal contours appear intact. Small amount of fluid in the fissures. IMPRESSION: Cardiac enlargement with vascular  congestion. Fluid in the fissures. No consolidation or edema. Electronically Signed   By: Burman Nieves M.D.   On: 07/22/2017 04:54    ____________________________________________   PROCEDURES  Critical Care performed: No   Procedure(s) performed:   Procedures   ____________________________________________   INITIAL IMPRESSION / ASSESSMENT AND PLAN / ED COURSE  As part of my medical decision making, I reviewed the following data within the electronic MEDICAL RECORD NUMBER Nursing notes reviewed and incorporated, Labs reviewed , EKG interpreted , Old chart reviewed, Radiograph reviewed  and Notes from prior ED visits    Differential diagnosis includes, but is not limited to, musculoskeletal pain from coughing, ACS, acute CHF exacerbation, pneumonia, pulmonary embolism.  This is a complicated patient but fortunately her work-up is generally reassuring today.  She has had tachycardia ranging from about 100-1 25, but she says this is normal for her since her diagnosis.  Her oxygenation has been appropriate and she does not have increased work of breathing.  Her chest pain is reproducible with cough and certain movements of her torso and deep breaths.  Her lab work is generally reassuring with a  normal CBC and metabolic panel other than a slightly low potassium which I will replete with an oral dose of 40 mEq by mouth.  I checked 2 troponins and both of them were normal.  Her BNP is slightly elevated at 589 but this is lower than it was when she was first diagnosed.  She has some pulmonary vascular congestion but no overt pulmonary edema.  There is no reason to think that in addition to all of her other recent diagnoses she would have developed pulmonary embolism since the last time she was evaluated for it between 2-3 weeks ago.  She is stable and again I think that most likely her symptoms are musculoskeletal.  However since she does seem to have some fluid in her fissures on the chest  radiograph, I will give her an extra dose of Lasix 20 mg IV.  No additional medication changes are required.  I will encourage the use of over-the-counter pain medication and close outpatient follow-up as soon as possible after Memorial Day weekend.  I gave strict return precautions.  She understands and agrees with the plan.    ____________________________________________  FINAL CLINICAL IMPRESSION(S) / ED DIAGNOSES  Final diagnoses:  Chest pain, unspecified type  Cough  Peripartum cardiomyopathy     MEDICATIONS GIVEN DURING THIS VISIT:  Medications  furosemide (LASIX) injection 20 mg (20 mg Intravenous Given 07/22/17 0730)     ED Discharge Orders    None       Note:  This document was prepared using Dragon voice recognition software and may include unintentional dictation errors.    Loleta Rose, MD 07/22/17 [phone]

## 2017-07-22 NOTE — ED Notes (Signed)
Pt sleeping. 

## 2017-07-22 NOTE — ED Triage Notes (Signed)
Pt with history of cardiomyopathy. Pt has a life vest in place. Pt states has chest pain midsternal. Pt states has had pain for 3 hours. Pt states shob and dizzy.

## 2017-07-22 NOTE — ED Notes (Signed)
NAD noted at time of D/C. Pt denies questions or concerns. Pt ambulatory to the lobby at this time. Pt refused wheelchair to the lobby.  

## 2018-03-12 ENCOUNTER — Ambulatory Visit
Admission: EM | Admit: 2018-03-12 | Discharge: 2018-03-12 | Disposition: A | Discharge status: Home / Self Care | Payer: Medicaid Other | Attending: Family Medicine | Admitting: Family Medicine

## 2018-03-12 ENCOUNTER — Other Ambulatory Visit: Payer: Self-pay

## 2018-03-12 ENCOUNTER — Encounter: Payer: Self-pay | Admitting: Emergency Medicine

## 2018-03-12 DIAGNOSIS — R69 Illness, unspecified: Secondary | ICD-10-CM | POA: Insufficient documentation

## 2018-03-12 DIAGNOSIS — B9689 Other specified bacterial agents as the cause of diseases classified elsewhere: Secondary | ICD-10-CM | POA: Insufficient documentation

## 2018-03-12 DIAGNOSIS — Z113 Encounter for screening for infections with a predominantly sexual mode of transmission: Secondary | ICD-10-CM | POA: Insufficient documentation

## 2018-03-12 DIAGNOSIS — N760 Acute vaginitis: Secondary | ICD-10-CM | POA: Insufficient documentation

## 2018-03-12 DIAGNOSIS — J111 Influenza due to unidentified influenza virus with other respiratory manifestations: Secondary | ICD-10-CM

## 2018-03-12 DIAGNOSIS — R0981 Nasal congestion: Secondary | ICD-10-CM

## 2018-03-12 DIAGNOSIS — R05 Cough: Secondary | ICD-10-CM

## 2018-03-12 DIAGNOSIS — R6883 Chills (without fever): Secondary | ICD-10-CM

## 2018-03-12 HISTORY — DX: Heart failure, unspecified: I50.9

## 2018-03-12 LAB — CHLAMYDIA/NGC RT PCR (ARMC ONLY)
Chlamydia Tr: NOT DETECTED
N GONORRHOEAE: NOT DETECTED

## 2018-03-12 LAB — WET PREP, GENITAL
Sperm: NONE SEEN
TRICH WET PREP: NONE SEEN
YEAST WET PREP: NONE SEEN

## 2018-03-12 LAB — RAPID INFLUENZA A&B ANTIGENS (ARMC ONLY): INFLUENZA B (ARMC): NEGATIVE

## 2018-03-12 LAB — RAPID INFLUENZA A&B ANTIGENS: Influenza A (ARMC): NEGATIVE

## 2018-03-12 MED ORDER — FLUCONAZOLE 150 MG PO TABS: 150.0000 mg | ORAL_TABLET | Freq: Every day | ORAL | 0 refills | Status: DC

## 2018-03-12 MED ORDER — OSELTAMIVIR PHOSPHATE 75 MG PO CAPS: 75.0000 mg | ORAL_CAPSULE | Freq: Two times a day (BID) | ORAL | 0 refills | Status: DC

## 2018-03-12 MED ORDER — METRONIDAZOLE 500 MG PO TABS: 500.0000 mg | ORAL_TABLET | Freq: Two times a day (BID) | ORAL | 0 refills | Status: DC

## 2018-03-12 MED ORDER — ACETAMINOPHEN 500 MG PO TABS
1000.0000 mg | ORAL_TABLET | Freq: Once | ORAL | Status: AC
  Administered 2018-03-12: 1000 mg via ORAL

## 2018-03-12 NOTE — ED Triage Notes (Signed)
Mom states she and child developed flu, like symptoms, cough, chills last night 

## 2018-03-12 NOTE — Discharge Instructions (Signed)
Take medication as prescribed. Rest. Drink plenty of fluids. Safe sex. Pelvic rest.   Follow up with your primary care physician this week for follow up. Return to Urgent care as needed. Proceed directly to emergency room for chest pain or worsening concerns.

## 2018-03-12 NOTE — ED Provider Notes (Signed)
[facility] ____________________________________________  Time seen: Approximately 7:26 PM  I have reviewed the triage vital signs and the nursing notes.   HISTORY  Chief Complaint Cough and std testing  HPI Allison Adams is a 28 y.o. 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  Reports her oldest was diagnosed with influenza 2 days ago, and reports her youngest child also with similar complaints.  Denies current pregnancy.  Not breast-feeding.  States some sore throat but only with coughing.  Last took Alka-Seltzer cold this afternoon, states that it did have Tylenol in it.  Denies taken other over-the-counter medications for the same complaints.  Continues eat and drink well.  States did have some brief abdominal discomfort earlier today, but reports it is better.  No vomiting or diarrhea.  States 2 days ago she had unprotected sex and would like to have evaluation for gonorrhea, chlamydia and trichomonas.  Declines any concerns of pregnancy.  Denies vaginal discharge, vaginal pain, vaginal lesions, dysuria.  Patient further requests testing for HIV and syphilis, declines other further STD testing.  Denies current chest pain, shortness of breath or other complaints.  Denies recent sickness.  [place]: PCP Patient's last menstrual period was 03/01/2018.    Past Medical History:  Diagnosis Date  . CHF (congestive heart failure) [facility](HCC)     Patient Active Problem List   Diagnosis Date Noted  . Cardiomyopathy (HCC) 07/07/2017    Past Surgical History:  Procedure Laterality Date  . CESAREAN SECTION       No current facility-administered medications for this encounter.   Current Outpatient Medications:  .  furosemide (LASIX) 10 MG/ML solution, Take by mouth daily., Disp: , Rfl:  .  aspirin EC  325 MG EC tablet, Take 1 tablet (325 mg total) by mouth daily., Disp: 30 tablet, Rfl: 0 .  fluconazole (DIFLUCAN) 150 MG tablet, Take 1 tablet (150 mg total) by mouth daily. Take one pill orally as needed, Disp: 1 tablet, Rfl: 0 .  metroNIDAZOLE (FLAGYL) 500 MG tablet, Take 1 tablet (500 mg total) by mouth 2 (two) times daily., Disp: 14 tablet, Rfl: 0 .  oseltamivir (TAMIFLU) 75 MG capsule, Take 1 capsule (75 mg total) by mouth every 12 (twelve) hours., Disp: 10 capsule, Rfl: 0 .  prenatal vitamin w/FE, FA (PRENATAL 1 + 1) 27-1 MG TABS tablet, Take 1 tablet by mouth daily at 12 noon., Disp: , Rfl:   Allergies Patient has no known allergies.  Family History  Problem Relation Age of Onset  . Diabetes Mother   . Lupus Mother     Social History Social History   Tobacco Use  . Smoking status: Never Smoker  . Smokeless tobacco: Never Used  Substance Use Topics  . Alcohol use: No  . Drug use: No    Review of Systems Constitutional: Subjective fever Eyes: No visual changes. ENT: as above.  Cardiovascular: Denies chest pain. Respiratory: Denies shortness of breath. Gastrointestinal: As above. no nausea, no vomiting.  No diarrhea.  No constipation. Genitourinary: Negative for dysuria. Musculoskeletal: Negative for back pain. Skin: Negative for rash.   ____________________________________________   PHYSICAL EXAM:  VITAL SIGNS: ED Triage Vitals  Enc Vitals Group     BP 03/12/18 1757 (!) 101/59     Pulse Rate 03/12/18 1757 (!) 109     Resp 03/12/18 1757 18  Temp 03/12/18 1757 98.8 F (37.1 C)     Temp Source 03/12/18 1757 Oral     SpO2 03/12/18 1757 99 %     Weight 03/12/18 1758 120 lb (54.4 kg)     Height 03/12/18 1758 4\' 11"  (1.499 m)     Head Circumference --      Peak Flow --      Pain Score 03/12/18 1757 10     Pain Loc --      Pain Edu? --      Excl. in [place]? --    Constitutional: Alert and oriented. Well appearing and in no acute distress. Eyes: Conjunctivae  are normal.  Head: Atraumatic. No sinus tenderness to palpation. No swelling. No erythema.  Ears: no erythema, normal TMs bilaterally.   Nose:Nasal congestion  Mouth/Throat: Mucous membranes are moist. No pharyngeal erythema. No tonsillar swelling or exudate.  Neck: No stridor.  No cervical spine tenderness to palpation. Hematological/Lymphatic/Immunilogical: No cervical lymphadenopathy. Cardiovascular: Normal rate, regular rhythm. Grossly normal heart sounds.  Good peripheral circulation. Respiratory: Normal respiratory effort.  No retractions. No wheezes, rales or rhonchi. Good air movement.  Gastrointestinal:  Normal Bowel sounds.  Minimal midline suprapubic tenderness palpation.  Abdomen otherwise soft nontender.  No CVA tenderness. Musculoskeletal: Ambulatory with steady gait. No cervical, thoracic or lumbar tenderness to palpation. Neurologic:  Normal speech and language. No gait instability. Skin:  Skin appears warm, dry and intact. No rash noted. Psychiatric: Mood and affect are normal. Speech and behavior are normal. ___________________________________________   LABS (all labs ordered are listed, but only abnormal results are displayed)  Labs Reviewed  WET PREP, GENITAL - Abnormal; Notable for the following components:      Result Value   Clue Cells Wet Prep HPF POC PRESENT (*)    WBC, Wet Prep HPF POC RARE (*)    All other components within normal limits  RAPID INFLUENZA A&B ANTIGENS ([facility] ONLY)  CHLAMYDIA/NGC RT PCR ([facility] ONLY)  RPR  HIV ANTIBODY (ROUTINE TESTING W REFLEX)     PROCEDURES Procedures    INITIAL IMPRESSION / ASSESSMENT AND PLAN / ED COURSE  Pertinent labs & imaging results that were available during my care of the patient were reviewed by me and considered in my medical decision making (see chart for details).  Overall well-appearing patient.  No acute distress.  Reports her oldest child positive for influenza 2 days ago, her youngest child  positive for influenza today.  Patient with influenza-like symptoms, suspect influenza.  Discussed treatment options with patient.  Will treat with oral Tamiflu.  Discussed over-the-counter Tylenol, ibuprofen, rest, fluids.  Encourage primary care follow-up in 2 to 3 days due to patient's cardiac history.  Patient denies current chest pain.  Patient also further requests STD testing, denies complaints.  Patient elected for self wet prep and self gonorrhea and chlamydia swab as well as HIV and RPR testing, declined other STD test.  Wet prep positive for clue cells, history of BV, will treat with Flagyl.  Encourage pelvic rest, supportive care and discussed her follow-up and return parameters.Discussed indication, risks and benefits of medications with patient.  Discussed follow up with Primary care physician this week. Discussed follow up and return parameters including no resolution or any worsening concerns. Patient verbalized understanding and agreed to plan.   ____________________________________________   FINAL CLINICAL IMPRESSION(S) / ED DIAGNOSES  Final diagnoses:  Influenza-like illness  Bacterial vaginosis  Screen for STD (sexually transmitted disease)     ED Discharge  Orders         Ordered    oseltamivir (TAMIFLU) 75 MG capsule  Every 12 hours     03/12/18 1928    metroNIDAZOLE (FLAGYL) 500 MG tablet  2 times daily     03/12/18 1928    fluconazole (DIFLUCAN) 150 MG tablet  Daily     03/12/18 1946           Note: This dictation was prepared with Dragon dictation along with smaller phrase technology. Any transcriptional errors that result from this process are unintentional.         Renford Dills, NP 03/12/18 2013

## 2018-03-14 LAB — HIV ANTIBODY (ROUTINE TESTING W REFLEX): HIV Screen 4th Generation wRfx: NONREACTIVE

## 2018-03-14 LAB — RPR: RPR Ser Ql: NONREACTIVE

## 2018-10-02 ENCOUNTER — Ambulatory Visit
Admission: EM | Admit: 2018-10-02 | Discharge: 2018-10-02 | Disposition: A | Discharge status: Home / Self Care | Payer: Self-pay | Attending: Emergency Medicine | Admitting: Emergency Medicine

## 2018-10-02 ENCOUNTER — Other Ambulatory Visit: Payer: Self-pay

## 2018-10-02 ENCOUNTER — Encounter: Payer: Self-pay | Admitting: Emergency Medicine

## 2018-10-02 DIAGNOSIS — B9689 Other specified bacterial agents as the cause of diseases classified elsewhere: Secondary | ICD-10-CM

## 2018-10-02 DIAGNOSIS — N760 Acute vaginitis: Secondary | ICD-10-CM

## 2018-10-02 LAB — CHLAMYDIA/NGC RT PCR (ARMC ONLY)??????????: Chlamydia Tr: NOT DETECTED

## 2018-10-02 LAB — WET PREP, GENITAL
Sperm: NONE SEEN
Trich, Wet Prep: NONE SEEN
Yeast Wet Prep HPF POC: NONE SEEN

## 2018-10-02 LAB — CHLAMYDIA/NGC RT PCR (ARMC ONLY): N gonorrhoeae: NOT DETECTED

## 2018-10-02 MED ORDER — METRONIDAZOLE 500 MG PO TABS: 500.0000 mg | ORAL_TABLET | Freq: Two times a day (BID) | ORAL | 0 refills | Status: DC

## 2018-10-02 NOTE — Discharge Instructions (Signed)
Make sure you keep your appointment with your cardiologist.

## 2018-10-02 NOTE — ED Triage Notes (Signed)
Pt c/o clear/white vaginal discharge. Started about 2 days ago. She has h/o BV and yeast infections. She would also like to get tested for STD's.

## 2018-10-02 NOTE — ED Provider Notes (Signed)
[facility]    CSN: [id number] Arrival date & time: 10/02/18  [id number]      History   Chief Complaint Chief Complaint  Patient presents with  . Vaginal Discharge    HPI Allison Adams is a 28 y.o. female presenting with vaginal discharge for two days. Pt is sexually active and has had BV and yeast infections in the past. Pt used a boric suppository that she had "left over" and felt that her symptoms decreased. Pt denies N/V/D, fever or body aches. Pt is also requesting testing for gonorrhea and chlamydia    Past Medical History:  Diagnosis Date  . CHF (congestive heart failure) [facility](HCC)     Patient Active Problem List   Diagnosis Date Noted  . Cardiomyopathy (HCC) 07/07/2017    Past Surgical History:  Procedure Laterality Date  . CESAREAN SECTION      OB History    Gravida  1   Para      Term      Preterm      AB      Living        SAB      TAB      Ectopic      Multiple      Live Births               Home Medications    Prior to Admission medications   Medication Sig Start Date End Date Taking? Authorizing Provider  furosemide (LASIX) 10 MG/ML solution Take by mouth daily.   Yes [provider]  losartan (COZAAR) 25 MG tablet TAKE 1 2 (ONE HALF) TABLET BY MOUTH ONCE DAILY 07/19/18  Yes [provider]  spironolactone (ALDACTONE) 25 MG tablet TAKE 1 2 (ONE HALF) TABLET BY MOUTH ONCE DAILY 07/19/18  Yes [provider]  aspirin EC 325 MG EC tablet Take 1 tablet (325 mg total) by mouth daily. 07/08/17   Barbaraann RondoSridharan, Prasanna, MD  fluconazole (DIFLUCAN) 150 MG tablet Take 1 tablet (150 mg total) by mouth daily. Take one pill orally as needed 03/12/18   Renford DillsMiller, Lindsey, NP  metroNIDAZOLE (FLAGYL) 500 MG tablet Take 1 tablet (500 mg total) by mouth 2 (two) times daily. 10/02/18   Bailey MechBenjamin, Norrin Shreffler, NP  oseltamivir (TAMIFLU) 75 MG capsule Take 1 capsule (75 mg total) by mouth every 12 (twelve) hours. 03/12/18   Renford DillsMiller, Lindsey,  NP  prenatal vitamin w/FE, FA (PRENATAL 1 + 1) 27-1 MG TABS tablet Take 1 tablet by mouth daily at 12 noon.    [provider]    Family History Family History  Problem Relation Age of Onset  . Diabetes Mother   . Lupus Mother     Social History Social History   Tobacco Use  . Smoking status: Never Smoker  . Smokeless tobacco: Never Used  Substance Use Topics  . Alcohol use: No  . Drug use: No     Allergies   Patient has no known allergies.   Review of Systems Review of Systems  Constitutional: Negative for chills and fever.  HENT: Negative for ear pain and sore throat.   Eyes: Negative for pain and visual disturbance.  Respiratory: Negative for cough and shortness of breath.   Cardiovascular: Negative for chest pain and palpitations.  Gastrointestinal: Negative for abdominal pain and vomiting.  Genitourinary: Positive for vaginal discharge. Negative for dysuria, hematuria and vaginal bleeding.  Musculoskeletal: Negative for arthralgias and back pain.  Skin: Negative for color change and rash.  Neurological: Negative for seizures and syncope.  All other systems reviewed and are negative.    Physical Exam Triage Vital Signs ED Triage Vitals  Enc Vitals Group     BP 10/02/18 0941 107/82     Pulse Rate 10/02/18 0941 97     Resp 10/02/18 0941 18     Temp 10/02/18 0941 98.4 F (36.9 C)     Temp Source 10/02/18 0941 Oral     SpO2 10/02/18 0941 100 %     Weight 10/02/18 0937 128 lb (58.1 kg)     Height 10/02/18 0937 4\' 11"  (1.499 m)     Head Circumference --      Peak Flow --      Pain Score 10/02/18 0937 0     Pain Loc --      Pain Edu? --      Excl. in [place]? --    No data found.  Updated Vital Signs BP 107/82 (BP Location: Left Arm)   Pulse 97   Temp 98.4 F (36.9 C) (Oral)   Resp 18   Ht 4\' 11"  (1.499 m)   Wt 128 lb (58.1 kg)   LMP 09/22/2018 (Approximate)   SpO2 100%   BMI 25.85 kg/m   Visual Acuity Right Eye Distance:   Left Eye  Distance:   Bilateral Distance:    Right Eye Near:   Left Eye Near:    Bilateral Near:     Physical Exam Vitals signs and nursing note reviewed.  Constitutional:      General: She is not in acute distress.    Appearance: She is well-developed.  HENT:     Head: Normocephalic and atraumatic.  Eyes:     Conjunctiva/sclera: Conjunctivae normal.  Neck:     Musculoskeletal: Neck supple.  Cardiovascular:     Rate and Rhythm: Normal rate and regular rhythm.     Heart sounds: Murmur present.  Pulmonary:     Effort: Pulmonary effort is normal. No respiratory distress.     Breath sounds: Normal breath sounds.  Abdominal:     Palpations: Abdomen is soft.     Tenderness: There is no abdominal tenderness.  Skin:    General: Skin is warm and dry.  Neurological:     Mental Status: She is alert.      UC Treatments / Results  Labs (all labs ordered are listed, but only abnormal results are displayed) Labs Reviewed  WET PREP, GENITAL - Abnormal; Notable for the following components:      Result Value   Clue Cells Wet Prep HPF POC PRESENT (*)    WBC, Wet Prep HPF POC FEW (*)    All other components within normal limits  CHLAMYDIA/NGC RT PCR [facility] ONLY)    EKG   Radiology No results found.  Procedures Procedures (including critical care time)  Medications Ordered in UC Medications - No data to display  Initial Impression / Assessment and Plan / UC Course  I have reviewed the triage vital signs and the nursing notes.  Pertinent labs & imaging results that were available during my care of the patient were reviewed by me and considered in my medical decision making (see chart for details).     Pt presents with vaginal discharge, diagnosed with bacterial vaginosis and treated as such with the prescribed medications below. Pt instructed on proper abx use and told to not drink while on medication.   Pt and NP also had a detailed discussion regarding cardiac  history.  Pt encouraged to complete the "charity care" paperwork to received the necessary treatment for her heart failure. Pt hesitant, but agreed.   Final Clinical Impressions(s) / UC Diagnoses   Final diagnoses:  BV (bacterial vaginosis)     Discharge Instructions     Make sure you keep your appointment with your cardiologist.    ED Prescriptions    Medication Sig Dispense Auth. Provider   metroNIDAZOLE (FLAGYL) 500 MG tablet Take 1 tablet (500 mg total) by mouth 2 (two) times daily. 14 tablet Bailey MechBenjamin, Damia Bobrowski, NP      Bailey MechLunise Shanisha Lech, DNP, NP-c    Bailey MechBenjamin, Dali Kraner, NP 10/02/18 1045

## 2018-12-14 IMAGING — CR DG CHEST 2V
2 series · 2 of 2 positions shown · non-contrast
Comparison: 07/06/2017

CLINICAL DATA: History of cardiomyopathy. Midsternal chest pain for
3 hours. Shortness of breath and dizziness.

EXAM:
CHEST - 2 VIEW

[chest pa]
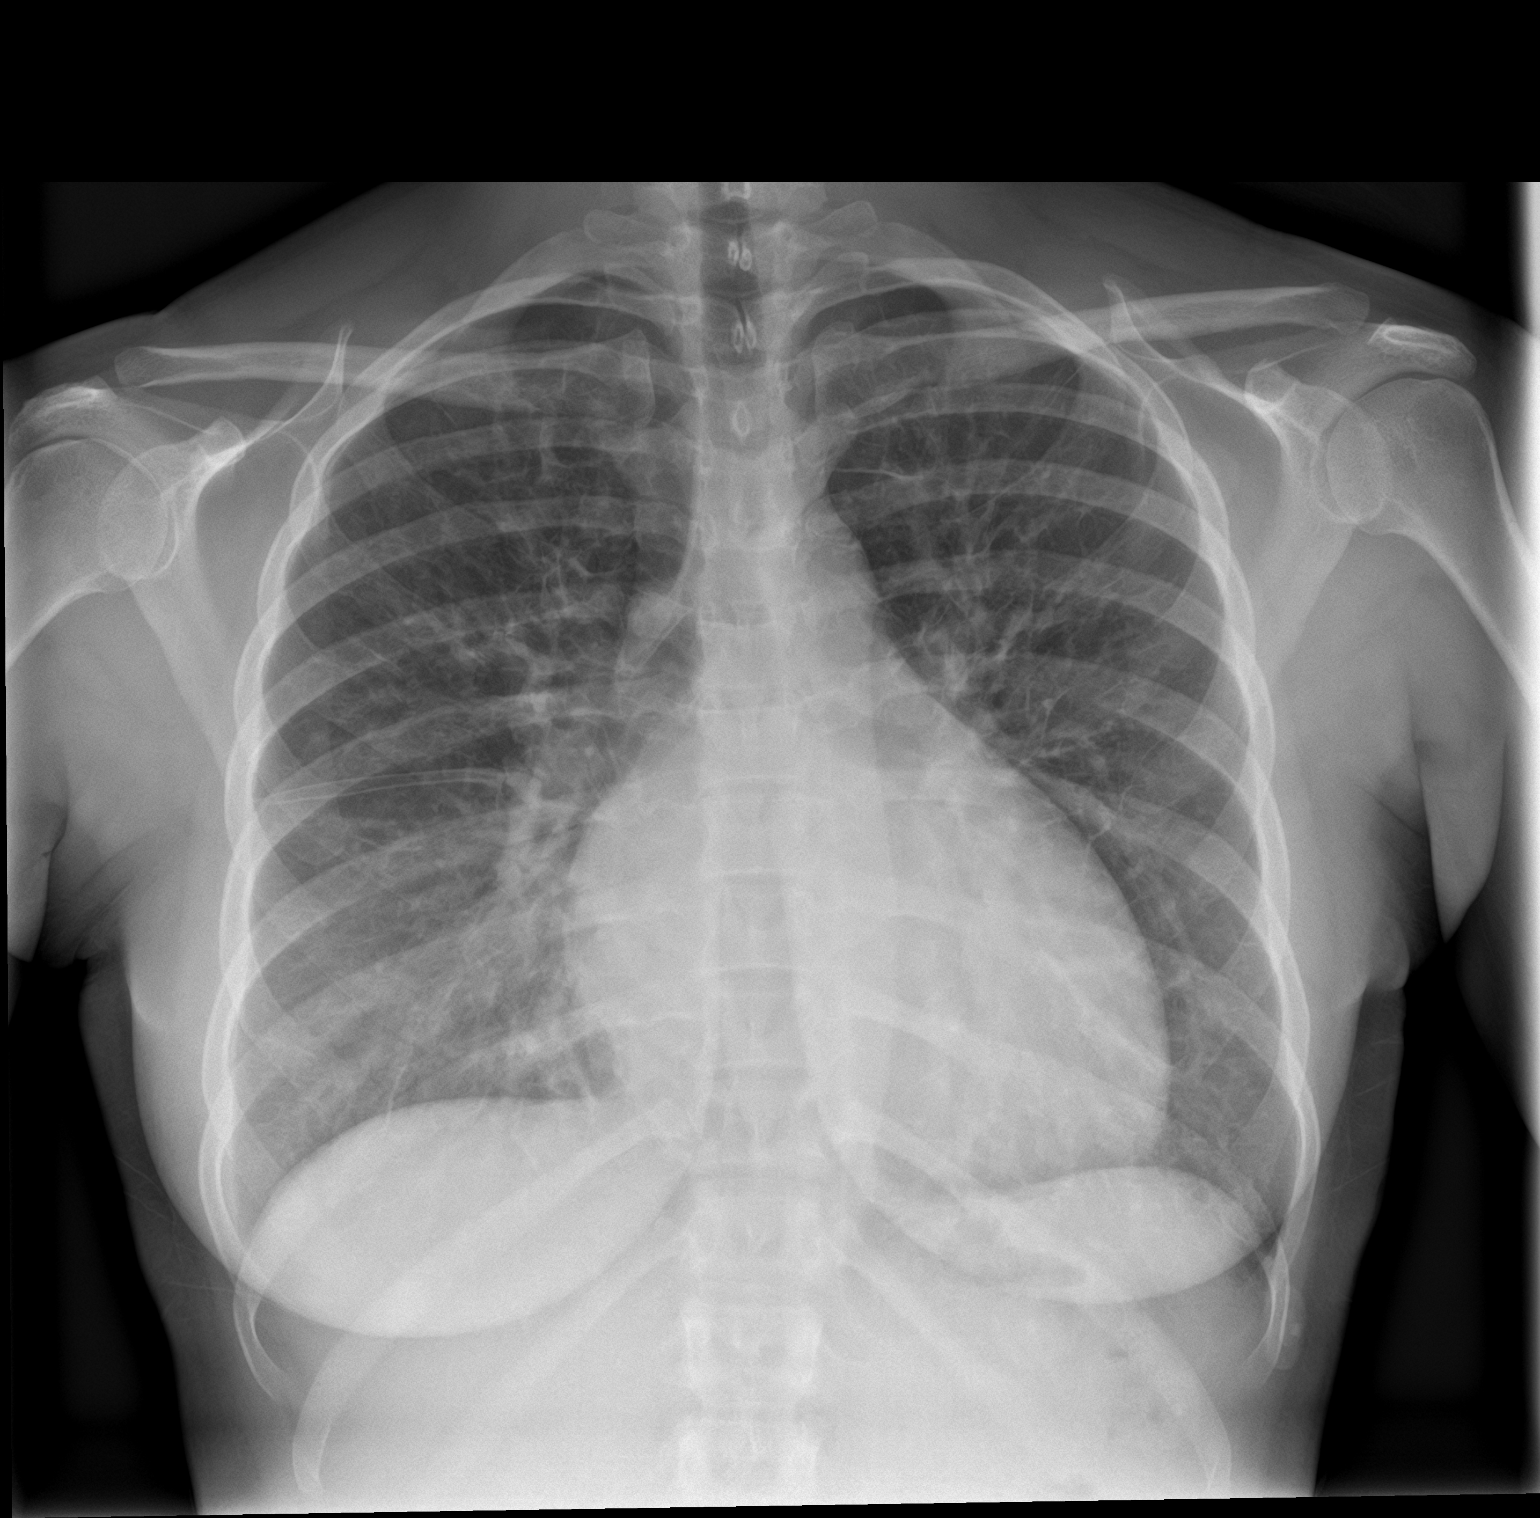

[chest lat]
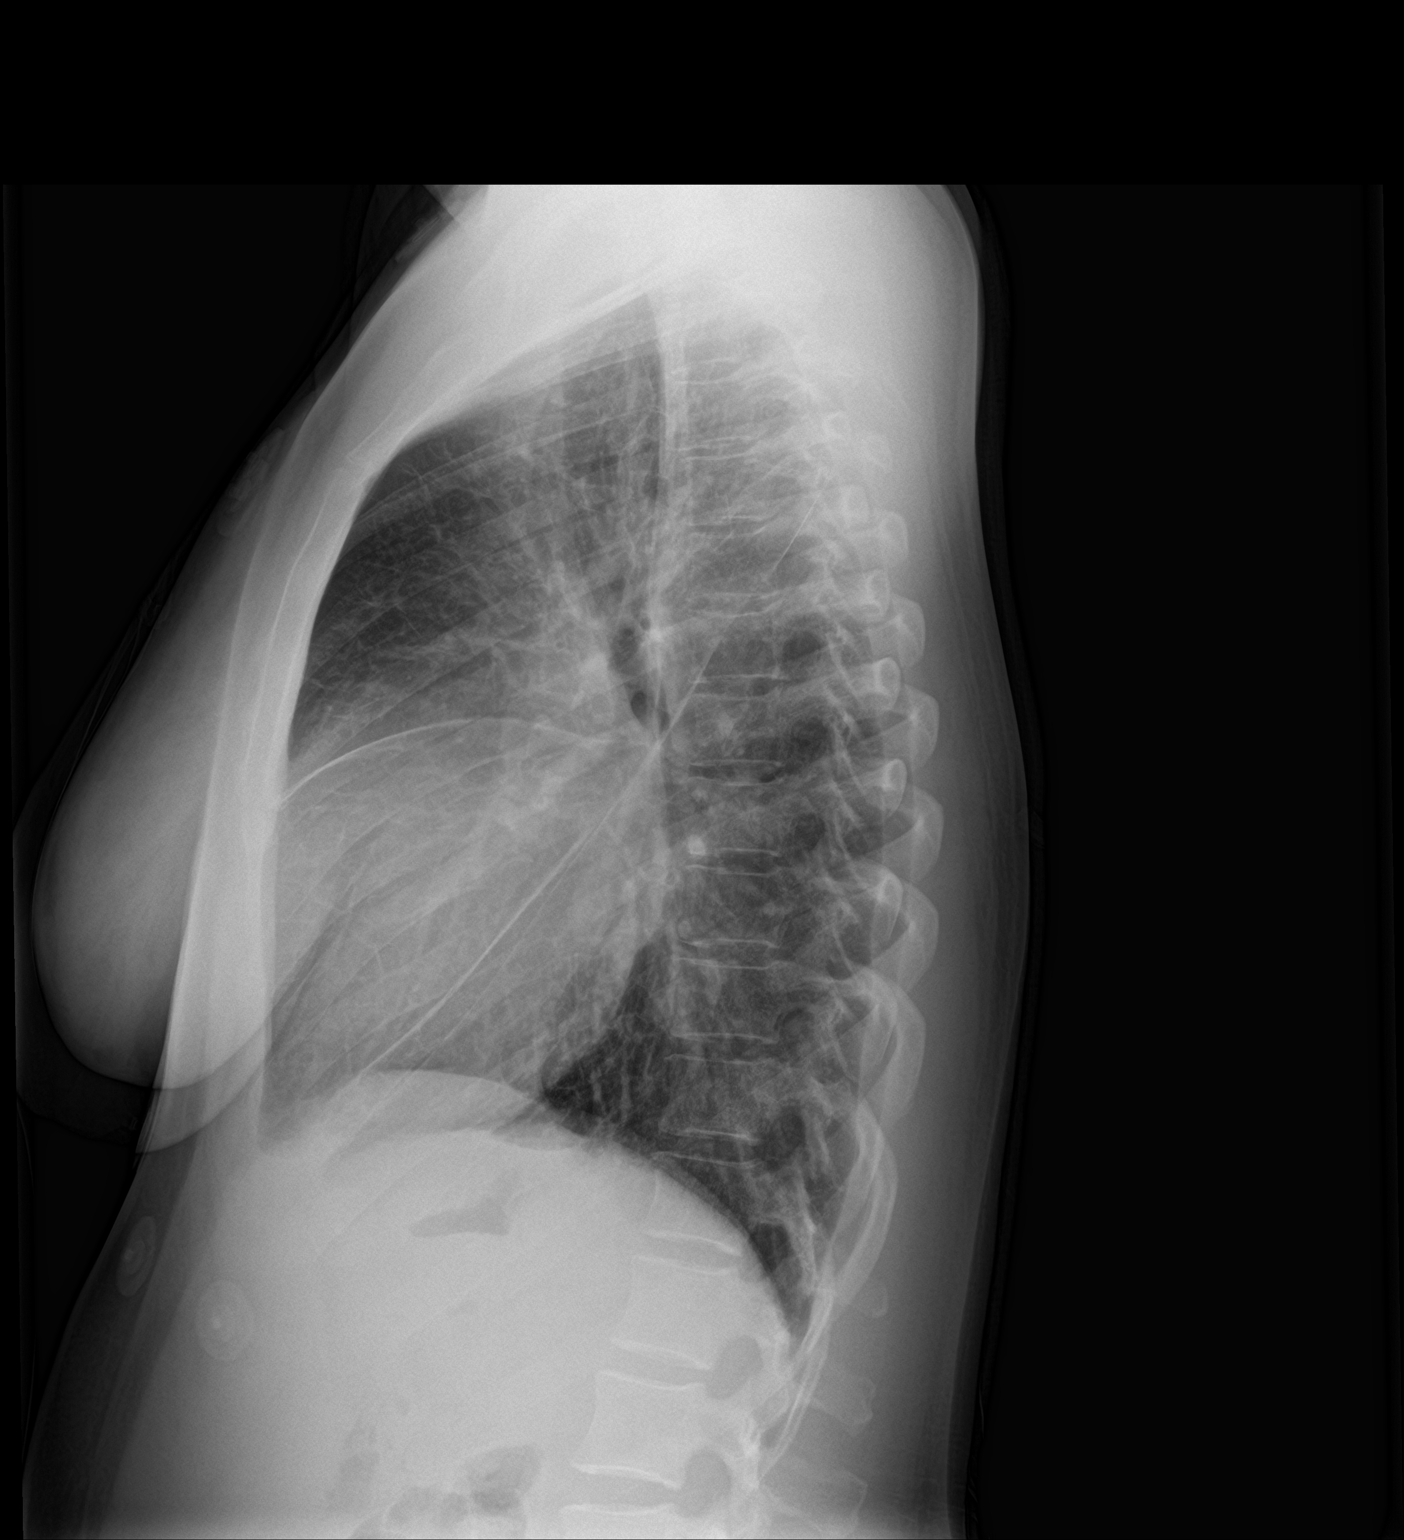

[2 of 2 positions shown; findings below may reference images not displayed]

FINDINGS: Cardiac enlargement with mild central vascular congestion. No edema
or consolidation. No blunting of costophrenic angles. No
pneumothorax. Mediastinal contours appear intact. Small amount of
fluid in the fissures.
IMPRESSION: Cardiac enlargement with vascular congestion. Fluid in the fissures.
No consolidation or edema.

## 2019-01-26 ENCOUNTER — Encounter: Payer: Self-pay | Admitting: Gynecology

## 2019-01-26 ENCOUNTER — Ambulatory Visit
Admission: EM | Admit: 2019-01-26 | Discharge: 2019-01-26 | Disposition: A | Discharge status: Home / Self Care | Payer: Medicaid Other | Attending: Emergency Medicine | Admitting: Emergency Medicine

## 2019-01-26 ENCOUNTER — Other Ambulatory Visit: Payer: Self-pay

## 2019-01-26 DIAGNOSIS — N760 Acute vaginitis: Secondary | ICD-10-CM | POA: Diagnosis not present

## 2019-01-26 DIAGNOSIS — B9689 Other specified bacterial agents as the cause of diseases classified elsewhere: Secondary | ICD-10-CM | POA: Diagnosis not present

## 2019-01-26 DIAGNOSIS — Z113 Encounter for screening for infections with a predominantly sexual mode of transmission: Secondary | ICD-10-CM | POA: Diagnosis not present

## 2019-01-26 LAB — URINALYSIS, COMPLETE (UACMP) WITH MICROSCOPIC
Bacteria, UA: NONE SEEN
Bilirubin Urine: NEGATIVE
Glucose, UA: NEGATIVE mg/dL
Ketones, ur: NEGATIVE mg/dL
Leukocytes,Ua: NEGATIVE
Nitrite: NEGATIVE
Protein, ur: NEGATIVE mg/dL
Specific Gravity, Urine: 1.025 (ref 1.005–1.030)
WBC, UA: NONE SEEN WBC/hpf (ref 0–5)
pH: 5.5 (ref 5.0–8.0)

## 2019-01-26 LAB — WET PREP, GENITAL
Sperm: NONE SEEN
Trich, Wet Prep: NONE SEEN
Yeast Wet Prep HPF POC: NONE SEEN

## 2019-01-26 MED ORDER — METRONIDAZOLE 500 MG PO TABS: 500.0000 mg | ORAL_TABLET | Freq: Two times a day (BID) | ORAL | 0 refills | Status: AC

## 2019-01-26 MED ORDER — PRENATAL PLUS 27-1 MG PO TABS: 1.0000 | ORAL_TABLET | Freq: Every day | ORAL | 0 refills | Status: AC

## 2019-01-26 NOTE — Discharge Instructions (Addendum)
Talk to your doctor about the losartan ASAP.  It is recommended that you avoid this drug during pregnancy.  I am sending you home with Flagyl, which will take care of the BV.  Your urine did not have any evidence of a urinary tract infection or bacteria in it.

## 2019-01-26 NOTE — ED Provider Notes (Signed)
HPI  SUBJECTIVE:  Allison Adams is a G3, P2 28 y.o. female who presents with odorous thick vaginal discharge for "a few days".  She denies genital rash, itching.  No vomiting, fevers, abdominal, pelvic, back pain.  No vaginal bleeding.  No urinary urgency, frequency cloudy odorous urine, hematuria.  She is in a longtime monogamous relationship with a female who is asymptomatic, however she would like to be checked for STDs.  No recent antibiotics.  No aggravating or alleviating factors.  She has not tried anything for this.  She has a past medical history of cardiomyopathy, CHF, gonorrhea, chlamydia, frequent BV and yeast infection.  She has a history of UTI.  No history of HIV, HSV, syphilis, trichomonas, diabetes, pyelonephritis, PID, ectopic pregnancy.  She is currently 6 weeks 1 day pregnant by LMP.  She has not yet gotten prenatal care or had an ultrasound, but states that she plans to do so with her OB/GYN at [facility].  Past Medical History:  Diagnosis Date  . CHF (congestive heart failure) (HCC)     Past Surgical History:  Procedure Laterality Date  . CESAREAN SECTION      Family History  Problem Relation Age of Onset  . Diabetes Mother   . Lupus Mother     Social History   Tobacco Use  . Smoking status: Never Smoker  . Smokeless tobacco: Never Used  Substance Use Topics  . Alcohol use: No  . Drug use: No    No current facility-administered medications for this encounter.   Current Outpatient Medications:  .  furosemide (LASIX) 10 MG/ML solution, Take by mouth daily., Disp: , Rfl:  .  losartan (COZAAR) 25 MG tablet, TAKE 1 2 (ONE HALF) TABLET BY MOUTH ONCE DAILY, Disp: , Rfl:  .  spironolactone (ALDACTONE) 25 MG tablet, TAKE 1 2 (ONE HALF) TABLET BY MOUTH ONCE DAILY, Disp: , Rfl:  .  aspirin EC 325 MG EC tablet, Take 1 tablet (325 mg total) by mouth daily., Disp: 30 tablet, Rfl: 0 .  metoprolol succinate (TOPROL-XL) 25 MG 24 hr tablet, , Disp: , Rfl:  .  metroNIDAZOLE  (FLAGYL) 500 MG tablet, Take 1 tablet (500 mg total) by mouth 2 (two) times daily for 7 days., Disp: 14 tablet, Rfl: 0 .  prenatal vitamin w/FE, FA (PRENATAL 1 + 1) 27-1 MG TABS tablet, Take 1 tablet by mouth daily at 12 noon., Disp: 30 tablet, Rfl: 0  No Known Allergies   ROS  As noted in HPI.   Physical Exam  BP 105/81 (BP Location: Left Arm)   Pulse (!) 105   Temp 98.8 F (37.1 C) (Oral)   Resp 16   Ht 4\' 11"  (1.499 m)   Wt 58.1 kg   LMP 12/14/2018 (Approximate)   SpO2 100%   BMI 25.85 kg/m   Constitutional: Well developed, well nourished, no acute distress Eyes:  EOMI, conjunctiva normal bilaterally HENT: Normocephalic, atraumatic,mucus membranes moist Respiratory: Normal inspiratory effort Cardiovascular: Normal rate GI: nondistended.  No suprapubic, flank tenderness.  No tenderness right lower quadrant, left lower quadrant. Back: No CVAT GU: Deferred skin: No rash, skin intact Musculoskeletal: no deformities Neurologic: Alert & oriented x 3, no focal neuro deficits Psychiatric: Speech and behavior appropriate   ED Course   Medications - No data to display  Orders Placed This Encounter  Procedures  . Wet prep, genital    Standing Status:   Standing    Number of Occurrences:   1  . GC/Chlamydia  Probe Amp    Standing Status:   Standing    Number of Occurrences:   1  . Urinalysis, Complete w Microscopic    Standing Status:   Standing    Number of Occurrences:   1  . HIV Antibody (routine testing w rflx)    Standing Status:   Standing    Number of Occurrences:   1  . RPR    Standing Status:   Standing    Number of Occurrences:   1    Results for orders placed or performed during the hospital encounter of 01/26/19 (from the past 24 hour(s))  Wet prep, genital     Status: Abnormal   Collection Time: 01/26/19  9:43 AM  Result Value Ref Range   Yeast Wet Prep HPF POC NONE SEEN NONE SEEN   Trich, Wet Prep NONE SEEN NONE SEEN   Clue Cells Wet Prep HPF  POC PRESENT (A) NONE SEEN   WBC, Wet Prep HPF POC FEW (A) NONE SEEN   Sperm NONE SEEN   Urinalysis, Complete w Microscopic     Status: Abnormal   Collection Time: 01/26/19  9:43 AM  Result Value Ref Range   Color, Urine YELLOW YELLOW   APPearance HAZY (A) CLEAR   Specific Gravity, Urine 1.025 1.005 - 1.030   pH 5.5 5.0 - 8.0   Glucose, UA NEGATIVE NEGATIVE mg/dL   Hgb urine dipstick TRACE (A) NEGATIVE   Bilirubin Urine NEGATIVE NEGATIVE   Ketones, ur NEGATIVE NEGATIVE mg/dL   Protein, ur NEGATIVE NEGATIVE mg/dL   Nitrite NEGATIVE NEGATIVE   Leukocytes,Ua NEGATIVE NEGATIVE   Squamous Epithelial / LPF 0-5 0 - 5   WBC, UA NONE SEEN 0 - 5 WBC/hpf   RBC / HPF 6-10 0 - 5 RBC/hpf   Bacteria, UA NONE SEEN NONE SEEN   No results found.  ED Clinical Impression  1. BV (bacterial vaginosis)   2. Screening examination for STD (sexually transmitted disease)      ED Assessment/Plan  Deferred pelvic because patient has no vaginal complaints, abdominal back or pelvic pain.  She does have BV, which she states she gets frequently.  No yeast or trichomoniasis seen on wet prep.  Gonorrhea, chlamydia swab sent.  She would like to wait for her lab results prior to treatment if necessary.  Feel that this is reasonable.  Discussed with her that both she and her partner will need treatment if the gonorrhea and chlamydia come back positive.  Checking HIV, RPR.  Will send home with Flagyl, prenatal vitamins.  [facility]  UA negative for UTI, bacteriuria.  She does have trace blood however she is not complaining of vaginal bleeding.  Advised patient to talk to her PMD about the losartan.  Discussed labs, MDM, treatment plan, and plan for follow-up with patient. Discussed sn/sx that should prompt return to the ED. patient agrees with plan.   Meds ordered this encounter  Medications  . prenatal vitamin w/FE, FA (PRENATAL 1 + 1) 27-1 MG TABS tablet    Sig: Take 1 tablet by  mouth daily at 12 noon.    Dispense:  30 tablet    Refill:  0  . metroNIDAZOLE (FLAGYL) 500 MG tablet    Sig: Take 1 tablet (500 mg total) by mouth 2 (two) times daily for 7 days.    Dispense:  14 tablet    Refill:  0    *This clinic note was created  using [employer]. Therefore, there may be occasional mistakes despite careful proofreading.   ?    Melynda Ripple, MD 01/26/19 1013

## 2019-01-26 NOTE — ED Triage Notes (Signed)
Per patient with vaginal discharge x 2 days. Patient would like to be tested for STDs and is also 6-[redacted] weeks pregnant.

## 2019-01-27 LAB — RPR: RPR Ser Ql: NONREACTIVE

## 2019-01-27 LAB — HIV ANTIBODY (ROUTINE TESTING W REFLEX): HIV Screen 4th Generation wRfx: NONREACTIVE

## 2019-01-27 NOTE — ED Notes (Signed)
Pt called requesting a rx for diflucan due to her being on flagyl for bv. I spoke with Lunise who stated since she is early in pregnancy(6 weeks) she should try otc monistat if she develops symptoms. Relayed information to pt and she understood. No symptoms reported

## 2019-01-29 LAB — GC/CHLAMYDIA PROBE AMP
Chlamydia trachomatis, NAA: NEGATIVE
Neisseria Gonorrhoeae by PCR: NEGATIVE

## 2019-05-08 ENCOUNTER — Encounter: Payer: Self-pay | Admitting: Emergency Medicine

## 2019-05-08 ENCOUNTER — Ambulatory Visit
Admission: EM | Admit: 2019-05-08 | Discharge: 2019-05-08 | Disposition: A | Discharge status: Home / Self Care | Payer: Medicaid Other | Attending: Family Medicine | Admitting: Family Medicine

## 2019-05-08 ENCOUNTER — Other Ambulatory Visit: Payer: Self-pay

## 2019-05-08 DIAGNOSIS — B9689 Other specified bacterial agents as the cause of diseases classified elsewhere: Secondary | ICD-10-CM | POA: Diagnosis present

## 2019-05-08 DIAGNOSIS — N760 Acute vaginitis: Secondary | ICD-10-CM | POA: Insufficient documentation

## 2019-05-08 LAB — CHLAMYDIA/NGC RT PCR (ARMC ONLY)
Chlamydia Tr: NOT DETECTED
N gonorrhoeae: NOT DETECTED

## 2019-05-08 LAB — WET PREP, GENITAL
Sperm: NONE SEEN
Trich, Wet Prep: NONE SEEN
Yeast Wet Prep HPF POC: NONE SEEN

## 2019-05-08 MED ORDER — METRONIDAZOLE 500 MG PO TABS: 500.0000 mg | ORAL_TABLET | Freq: Two times a day (BID) | ORAL | 0 refills | Status: AC

## 2019-05-08 NOTE — ED Provider Notes (Signed)
[facility]    CSN: [id number] Arrival date & time: 05/08/19  1144      History   Chief Complaint Chief Complaint  Patient presents with  . Vaginal Discharge    HPI Allison Adams is a 29 y.o. female.   29 yo female with a c/o vaginal discharge for 4 days. Denies any fevers, chills, dysuria, itching, pain, vaginal bleeding. Requesting STD testing.    Vaginal Discharge   Past Medical History:  Diagnosis Date  . CHF (congestive heart failure) [facility])     Patient Active Problem List   Diagnosis Date Noted  . Cardiomyopathy ([place]) 07/07/2017    Past Surgical History:  Procedure Laterality Date  . CESAREAN SECTION      OB History    Gravida  2   Para      Term      Preterm      AB      Living        SAB      TAB      Ectopic      Multiple      Live Births               Home Medications    Prior to Admission medications   Medication Sig Start Date End Date Taking? Authorizing Provider  furosemide (LASIX) 10 MG/ML solution Take by mouth daily.   Yes [provider]  losartan (COZAAR) 25 MG tablet TAKE 1 2 (ONE HALF) TABLET BY MOUTH ONCE DAILY 07/19/18  Yes [provider]  metoprolol succinate (TOPROL-XL) 25 MG 24 hr tablet  12/06/18  Yes [provider]  spironolactone (ALDACTONE) 25 MG tablet TAKE 1 2 (ONE HALF) TABLET BY MOUTH ONCE DAILY 07/19/18  Yes [provider]  aspirin EC 325 MG EC tablet Take 1 tablet (325 mg total) by mouth daily. 07/08/17   Arta Silence, MD  metroNIDAZOLE (FLAGYL) 500 MG tablet Take 1 tablet (500 mg total) by mouth 2 (two) times daily. 05/08/19   Norval Gable, MD  prenatal vitamin w/FE, FA (PRENATAL 1 + 1) 27-1 MG TABS tablet Take 1 tablet by mouth daily at 12 noon. 01/26/19   Melynda Ripple, MD    Family History Family History  Problem Relation Age of Onset  . Diabetes Mother   . Lupus Mother   . Other Father        unkown medical history    Social  History Social History   Tobacco Use  . Smoking status: Never Smoker  . Smokeless tobacco: Never Used  Substance Use Topics  . Alcohol use: Yes    Comment: social  . Drug use: No     Allergies   Patient has no known allergies.   Review of Systems Review of Systems  Genitourinary: Positive for vaginal discharge.     Physical Exam Triage Vital Signs ED Triage Vitals  Enc Vitals Group     BP 05/08/19 1214 102/74     Pulse Rate 05/08/19 1214 (!) 113     Resp 05/08/19 1214 18     Temp 05/08/19 1214 98.6 F (37 C)     Temp Source 05/08/19 1214 Oral     SpO2 05/08/19 1214 100 %     Weight 05/08/19 1215 125 lb (56.7 kg)     Height 05/08/19 1215 4\' 11"  (1.499 m)     Head Circumference --      Peak Flow --      Pain  Score 05/08/19 1215 0     Pain Loc --      Pain Edu? --      Excl. in GC? --    No data found.  Updated Vital Signs BP 102/74 (BP Location: Left Arm)   Pulse (!) 113   Temp 98.6 F (37 C) (Oral)   Resp 18   Ht 4\' 11"  (1.499 m)   Wt 56.7 kg   LMP 04/22/2019 (Approximate)   SpO2 100%   Breastfeeding No   BMI 25.25 kg/m   Visual Acuity Right Eye Distance:   Left Eye Distance:   Bilateral Distance:    Right Eye Near:   Left Eye Near:    Bilateral Near:     Physical Exam Vitals and nursing note reviewed.  Constitutional:      General: She is not in acute distress.    Appearance: Normal appearance. She is not ill-appearing, toxic-appearing or diaphoretic.  Abdominal:     General: There is no distension.     Palpations: Abdomen is soft.  Neurological:     Mental Status: She is alert.      UC Treatments / Results  Labs (all labs ordered are listed, but only abnormal results are displayed) Labs Reviewed  WET PREP, GENITAL - Abnormal; Notable for the following components:      Result Value   Clue Cells Wet Prep HPF POC PRESENT (*)    WBC, Wet Prep HPF POC PRESENT (*)    All other components within normal limits  CHLAMYDIA/NGC RT PCR  [facility] ONLY)    EKG   Radiology No results found.  Procedures Procedures (including critical care time)  Medications Ordered in UC Medications - No data to display  Initial Impression / Assessment and Plan / UC Course  I have reviewed the triage vital signs and the nursing notes.  Pertinent labs & imaging results that were available during my care of the patient were reviewed by me and considered in my medical decision making (see chart for details).     Final Clinical Impressions(s) / UC Diagnoses   Final diagnoses:  BV (bacterial vaginosis)    ED Prescriptions    Medication Sig Dispense Auth. Provider   metroNIDAZOLE (FLAGYL) 500 MG tablet Take 1 tablet (500 mg total) by mouth 2 (two) times daily. 14 tablet [facility], MD     1. Lab results and diagnosis reviewed with patient 2. rx as per orders above; reviewed possible side effects, interactions, risks and benefits  3. Await GC/chlamydia 4. Follow-up prn if symptoms worsen or don't improve  PDMP not reviewed this encounter.   Payton Mccallum, MD 05/08/19 1931

## 2019-05-08 NOTE — ED Triage Notes (Signed)
Patient in today c/o vaginal discharge x 4 days. Patient requesting wet prep and std testing. Patient denies any new partners.
